# Patient Record
Sex: Male | Born: 1965
Health system: Midwestern US, Community
[De-identification: ages and names within clinical notes are randomized; demographics above are authoritative.]

## PROBLEM LIST (undated history)

## (undated) DIAGNOSIS — K219 Gastro-esophageal reflux disease without esophagitis: Secondary | ICD-10-CM

## (undated) DIAGNOSIS — C4490 Unspecified malignant neoplasm of skin, unspecified: Secondary | ICD-10-CM

## (undated) DIAGNOSIS — M5030 Other cervical disc degeneration, unspecified cervical region: Secondary | ICD-10-CM

## (undated) DIAGNOSIS — F32A Depression, unspecified: Secondary | ICD-10-CM

## (undated) DIAGNOSIS — F419 Anxiety disorder, unspecified: Secondary | ICD-10-CM

## (undated) DIAGNOSIS — R519 Headache, unspecified: Secondary | ICD-10-CM

## (undated) DIAGNOSIS — H547 Unspecified visual loss: Secondary | ICD-10-CM

## (undated) DIAGNOSIS — H5500 Unspecified nystagmus: Secondary | ICD-10-CM

## (undated) DIAGNOSIS — Z9289 Personal history of other medical treatment: Secondary | ICD-10-CM

## (undated) DIAGNOSIS — I2510 Atherosclerotic heart disease of native coronary artery without angina pectoris: Secondary | ICD-10-CM

## (undated) DIAGNOSIS — I219 Acute myocardial infarction, unspecified: Secondary | ICD-10-CM

## (undated) DIAGNOSIS — E781 Pure hyperglyceridemia: Secondary | ICD-10-CM

## (undated) DIAGNOSIS — M51379 Other intervertebral disc degeneration, lumbosacral region without mention of lumbar back pain or lower extremity pain: Secondary | ICD-10-CM

## (undated) DIAGNOSIS — F329 Major depressive disorder, single episode, unspecified: Secondary | ICD-10-CM

## (undated) DIAGNOSIS — C439 Malignant melanoma of skin, unspecified: Secondary | ICD-10-CM

## (undated) DIAGNOSIS — J189 Pneumonia, unspecified organism: Secondary | ICD-10-CM

## (undated) DIAGNOSIS — M5137 Other intervertebral disc degeneration, lumbosacral region: Secondary | ICD-10-CM

## (undated) DIAGNOSIS — R51 Headache: Secondary | ICD-10-CM

## (undated) HISTORY — PX: CARPAL TUNNEL WITH CUBITAL TUNNEL: SHX5608

## (undated) HISTORY — DX: Personal history of other medical treatment: Z92.89

## (undated) HISTORY — PX: MELANOMA EXCISION: SHX5266

## (undated) HISTORY — PX: SKIN CANCER EXCISION: SHX779

## (undated) HISTORY — PX: EYE MUSCLE SURGERY: SHX370

---

## 1993-08-20 HISTORY — PX: KNEE SURGERY: SHX244

## 2007-02-19 ENCOUNTER — Emergency Department (HOSPITAL_COMMUNITY): Admission: EM | Admit: 2007-02-19 | Discharge: 2007-02-19 | Payer: Self-pay | Admitting: Emergency Medicine

## 2007-02-28 ENCOUNTER — Emergency Department (HOSPITAL_COMMUNITY): Admission: EM | Admit: 2007-02-28 | Discharge: 2007-02-28 | Payer: Self-pay | Admitting: Emergency Medicine

## 2013-09-20 DIAGNOSIS — I219 Acute myocardial infarction, unspecified: Secondary | ICD-10-CM

## 2013-09-20 HISTORY — DX: Acute myocardial infarction, unspecified: I21.9

## 2013-10-03 ENCOUNTER — Encounter (HOSPITAL_COMMUNITY)
Admission: EM | Disposition: A | Discharge status: Home / Self Care | Payer: Self-pay | Source: Home / Self Care | Attending: Cardiology

## 2013-10-03 ENCOUNTER — Emergency Department (HOSPITAL_COMMUNITY): Payer: BC Managed Care – PPO

## 2013-10-03 ENCOUNTER — Encounter (HOSPITAL_COMMUNITY): Payer: Self-pay | Admitting: Emergency Medicine

## 2013-10-03 ENCOUNTER — Inpatient Hospital Stay (HOSPITAL_COMMUNITY)
Admission: EM | Admit: 2013-10-03 | Discharge: 2013-10-04 | DRG: 247 | Disposition: A | Discharge status: Home / Self Care | Payer: BC Managed Care – PPO | Attending: Cardiology | Admitting: Cardiology

## 2013-10-03 DIAGNOSIS — Z888 Allergy status to other drugs, medicaments and biological substances status: Secondary | ICD-10-CM

## 2013-10-03 DIAGNOSIS — I2510 Atherosclerotic heart disease of native coronary artery without angina pectoris: Secondary | ICD-10-CM

## 2013-10-03 DIAGNOSIS — I2582 Chronic total occlusion of coronary artery: Secondary | ICD-10-CM | POA: Diagnosis present

## 2013-10-03 DIAGNOSIS — H548 Legal blindness, as defined in USA: Secondary | ICD-10-CM | POA: Diagnosis present

## 2013-10-03 DIAGNOSIS — Z8249 Family history of ischemic heart disease and other diseases of the circulatory system: Secondary | ICD-10-CM

## 2013-10-03 DIAGNOSIS — E781 Pure hyperglyceridemia: Secondary | ICD-10-CM | POA: Diagnosis present

## 2013-10-03 DIAGNOSIS — I2119 ST elevation (STEMI) myocardial infarction involving other coronary artery of inferior wall: Principal | ICD-10-CM | POA: Diagnosis present

## 2013-10-03 DIAGNOSIS — Z720 Tobacco use: Secondary | ICD-10-CM | POA: Diagnosis present

## 2013-10-03 DIAGNOSIS — I219 Acute myocardial infarction, unspecified: Secondary | ICD-10-CM | POA: Diagnosis present

## 2013-10-03 DIAGNOSIS — F17200 Nicotine dependence, unspecified, uncomplicated: Secondary | ICD-10-CM | POA: Diagnosis present

## 2013-10-03 DIAGNOSIS — Z886 Allergy status to analgesic agent status: Secondary | ICD-10-CM

## 2013-10-03 HISTORY — DX: Unspecified visual loss: H54.7

## 2013-10-03 HISTORY — DX: Acute myocardial infarction, unspecified: I21.9

## 2013-10-03 HISTORY — DX: Atherosclerotic heart disease of native coronary artery without angina pectoris: I25.10

## 2013-10-03 HISTORY — DX: Pure hyperglyceridemia: E78.1

## 2013-10-03 HISTORY — DX: Unspecified nystagmus: H55.00

## 2013-10-03 HISTORY — PX: LEFT HEART CATHETERIZATION WITH CORONARY ANGIOGRAM: SHX5451

## 2013-10-03 LAB — POCT I-STAT TROPONIN I: Troponin i, poc: 0.02 ng/mL (ref 0.00–0.08)

## 2013-10-03 LAB — BASIC METABOLIC PANEL
BUN: 19 mg/dL (ref 6–23)
CO2: 24 mEq/L (ref 19–32)
Calcium: 9.1 mg/dL (ref 8.4–10.5)
Chloride: 102 mEq/L (ref 96–112)
Creatinine, Ser: 1.17 mg/dL (ref 0.50–1.35)
GFR calc Af Amer: 82 mL/min — ABNORMAL LOW (ref >90)
GFR calc non Af Amer: 71 mL/min — ABNORMAL LOW (ref >90)
Glucose, Bld: 122 mg/dL — ABNORMAL HIGH (ref 70–99)
Potassium: 4 mEq/L (ref 3.7–5.3)
Sodium: 139 mEq/L (ref 137–147)

## 2013-10-03 LAB — CBC
HCT: 43.7 % (ref 39.0–52.0)
Hemoglobin: 15.7 g/dL (ref 13.0–17.0)
MCH: 32.2 pg (ref 26.0–34.0)
MCHC: 35.9 g/dL (ref 30.0–36.0)
MCV: 89.7 fL (ref 78.0–100.0)
Platelets: 135 10*3/uL — ABNORMAL LOW (ref 150–400)
RBC: 4.87 MIL/uL (ref 4.22–5.81)
RDW: 13 % (ref 11.5–15.5)
WBC: 8.9 10*3/uL (ref 4.0–10.5)

## 2013-10-03 LAB — PROTIME-INR
INR: 0.93 (ref 0.00–1.49)
Prothrombin Time: 12.3 seconds (ref 11.6–15.2)

## 2013-10-03 LAB — MRSA PCR SCREENING: MRSA by PCR: NEGATIVE

## 2013-10-03 LAB — PRO B NATRIURETIC PEPTIDE: Pro B Natriuretic peptide (BNP): 15.4 pg/mL (ref 0–125)

## 2013-10-03 SURGERY — LEFT HEART CATHETERIZATION WITH CORONARY ANGIOGRAM
Anesthesia: LOCAL

## 2013-10-03 MED ORDER — MORPHINE SULFATE 4 MG/ML IJ SOLN: 4.0000 mg | Freq: Once | INTRAMUSCULAR | Status: DC

## 2013-10-03 MED ORDER — BIVALIRUDIN 250 MG IV SOLR
INTRAVENOUS | Status: AC
  Filled 2013-10-03: qty 250

## 2013-10-03 MED ORDER — ASPIRIN EC 81 MG PO TBEC
81.0000 mg | DELAYED_RELEASE_TABLET | Freq: Every day | ORAL | Status: DC
  Administered 2013-10-04: 81 mg via ORAL
  Filled 2013-10-03: qty 1

## 2013-10-03 MED ORDER — TIROFIBAN HCL IV 5 MG/100ML
0.1500 ug/kg/min | INTRAVENOUS | Status: AC
  Administered 2013-10-03 – 2013-10-04 (×4): 0.15 ug/kg/min via INTRAVENOUS
  Filled 2013-10-03 (×6): qty 100

## 2013-10-03 MED ORDER — ASPIRIN 300 MG RE SUPP: 300.0000 mg | RECTAL | Status: DC

## 2013-10-03 MED ORDER — HEPARIN (PORCINE) IN NACL 2-0.9 UNIT/ML-% IJ SOLN
INTRAMUSCULAR | Status: AC
  Filled 2013-10-03: qty 1000

## 2013-10-03 MED ORDER — ONDANSETRON HCL 4 MG/2ML IJ SOLN
INTRAMUSCULAR | Status: AC
  Filled 2013-10-03: qty 2

## 2013-10-03 MED ORDER — TICAGRELOR 90 MG PO TABS
ORAL_TABLET | ORAL | Status: AC
  Filled 2013-10-03: qty 1

## 2013-10-03 MED ORDER — NITROGLYCERIN IN D5W 200-5 MCG/ML-% IV SOLN: 3.0000 ug/min | INTRAVENOUS | Status: DC

## 2013-10-03 MED ORDER — ATORVASTATIN CALCIUM 80 MG PO TABS
80.0000 mg | ORAL_TABLET | Freq: Every day | ORAL | Status: DC
  Filled 2013-10-03: qty 1

## 2013-10-03 MED ORDER — SODIUM CHLORIDE 0.9 % IJ SOLN
3.0000 mL | Freq: Two times a day (BID) | INTRAMUSCULAR | Status: DC
  Administered 2013-10-04: 3 mL via INTRAVENOUS

## 2013-10-03 MED ORDER — ASPIRIN 81 MG PO CHEW: 324.0000 mg | CHEWABLE_TABLET | ORAL | Status: DC

## 2013-10-03 MED ORDER — SODIUM CHLORIDE 0.9 % IV SOLN
250.0000 mL | INTRAVENOUS | Status: DC | PRN
Start: 2013-10-03 — End: 2013-10-04
  Administered 2013-10-04: 04:00:00 via INTRAVENOUS

## 2013-10-03 MED ORDER — METHYLPREDNISOLONE SODIUM SUCC 125 MG IJ SOLR
125.0000 mg | Freq: Once | INTRAMUSCULAR | Status: AC
  Administered 2013-10-03: 125 mg via INTRAVENOUS

## 2013-10-03 MED ORDER — LIDOCAINE HCL (PF) 1 % IJ SOLN
INTRAMUSCULAR | Status: AC
  Filled 2013-10-03: qty 30

## 2013-10-03 MED ORDER — NITROGLYCERIN IN D5W 200-5 MCG/ML-% IV SOLN
2.0000 ug/min | Freq: Once | INTRAVENOUS | Status: AC
  Administered 2013-10-03: 5 ug/min via INTRAVENOUS

## 2013-10-03 MED ORDER — TIROFIBAN HCL IV 5 MG/100ML
INTRAVENOUS | Status: AC
  Filled 2013-10-03: qty 100

## 2013-10-03 MED ORDER — NITROGLYCERIN 0.4 MG SL SUBL
0.4000 mg | SUBLINGUAL_TABLET | SUBLINGUAL | Status: DC | PRN
Start: 2013-10-03 — End: 2013-10-04

## 2013-10-03 MED ORDER — SODIUM CHLORIDE 0.9 % IV SOLN: 1000.0000 mL | INTRAVENOUS | Status: DC

## 2013-10-03 MED ORDER — FENTANYL CITRATE 0.05 MG/ML IJ SOLN
INTRAMUSCULAR | Status: AC
  Filled 2013-10-03: qty 2

## 2013-10-03 MED ORDER — SODIUM CHLORIDE 0.9 % IV SOLN
1.0000 mL/kg/h | INTRAVENOUS | Status: AC
  Administered 2013-10-03: 1 mL/kg/h via INTRAVENOUS

## 2013-10-03 MED ORDER — ONDANSETRON HCL 4 MG/2ML IJ SOLN: 4.0000 mg | Freq: Four times a day (QID) | INTRAMUSCULAR | Status: DC | PRN

## 2013-10-03 MED ORDER — ASPIRIN 81 MG PO CHEW: 324.0000 mg | CHEWABLE_TABLET | Freq: Once | ORAL | Status: DC

## 2013-10-03 MED ORDER — HEPARIN BOLUS VIA INFUSION
4000.0000 [IU] | Freq: Once | INTRAVENOUS | Status: AC
  Administered 2013-10-03: 4000 [IU] via INTRAVENOUS

## 2013-10-03 MED ORDER — SODIUM CHLORIDE 0.9 % IJ SOLN: 3.0000 mL | INTRAMUSCULAR | Status: DC | PRN

## 2013-10-03 MED ORDER — HEPARIN (PORCINE) IN NACL 100-0.45 UNIT/ML-% IJ SOLN: 12.0000 [IU]/kg/h | Freq: Once | INTRAMUSCULAR | Status: DC

## 2013-10-03 MED ORDER — METHYLPREDNISOLONE SODIUM SUCC 125 MG IJ SOLR
INTRAMUSCULAR | Status: AC
  Filled 2013-10-03: qty 2

## 2013-10-03 MED ORDER — ACETAMINOPHEN 325 MG PO TABS: 650.0000 mg | ORAL_TABLET | ORAL | Status: DC | PRN

## 2013-10-03 MED ORDER — METOPROLOL TARTRATE 12.5 MG HALF TABLET
12.5000 mg | ORAL_TABLET | Freq: Two times a day (BID) | ORAL | Status: DC
  Administered 2013-10-03 – 2013-10-04 (×2): 12.5 mg via ORAL
  Filled 2013-10-03 (×3): qty 1

## 2013-10-03 MED ORDER — NITROGLYCERIN IN D5W 200-5 MCG/ML-% IV SOLN
INTRAVENOUS | Status: AC
  Filled 2013-10-03: qty 250

## 2013-10-03 MED ORDER — DIPHENHYDRAMINE HCL 50 MG/ML IJ SOLN
INTRAMUSCULAR | Status: AC
  Filled 2013-10-03: qty 1

## 2013-10-03 MED ORDER — DIPHENHYDRAMINE HCL 50 MG/ML IJ SOLN
25.0000 mg | Freq: Once | INTRAMUSCULAR | Status: AC
  Administered 2013-10-03: 25 mg via INTRAVENOUS

## 2013-10-03 MED ORDER — TICAGRELOR 90 MG PO TABS
90.0000 mg | ORAL_TABLET | Freq: Two times a day (BID) | ORAL | Status: DC
  Administered 2013-10-04: 90 mg via ORAL
  Filled 2013-10-03 (×3): qty 1

## 2013-10-03 MED ORDER — MIDAZOLAM HCL 2 MG/2ML IJ SOLN
INTRAMUSCULAR | Status: AC
  Filled 2013-10-03: qty 2

## 2013-10-03 MED ORDER — OXYCODONE-ACETAMINOPHEN 5-325 MG PO TABS
1.0000 | ORAL_TABLET | ORAL | Status: DC | PRN
Start: 2013-10-03 — End: 2013-10-04

## 2013-10-03 MED ORDER — SODIUM CHLORIDE 0.9 % IV SOLN: 1000.0000 mL | Freq: Once | INTRAVENOUS | Status: DC

## 2013-10-03 NOTE — ED Provider Notes (Signed)
CSN: [id number]     Arrival date & time 10/03/13  7 History   First MD Initiated Contact with Patient 10/03/13 1733     Chief Complaint  Patient presents with  . Chest Pain     HPI  Patient presents with new chest pain.  Pain began last one hour ago, as the patient was cleaning his house.  Since onset has been persistent.  Pain is largely about the anterior chest, greater on the right.  Pain is severe, sore, pressure-like with radiation towards his throat. Pain has not been improved with morphine, nitroglycerin. The pain is not pleuritic. There is mild associated dyspnea. There is mild associated lightheadedness, no syncope. There is associated nausea, no vomiting. Patient has no history of similar pain, no history of cardiac disease. Patient denies medical problems.  The patient does smoke. History of present illness is oriented to patient and EMS.  Patient was a CODE STEMI  History reviewed. No pertinent past medical history. Past Surgical History  Procedure Laterality Date  . Knee surgery     History reviewed. No pertinent family history. History  Substance Use Topics  . Smoking status: Current Every Day Smoker -- 1.00 packs/day    Types: Cigarettes  . Smokeless tobacco: Not on file  . Alcohol Use: Not on file    Review of Systems  Constitutional:       Per HPI, otherwise negative  HENT:       Per HPI, otherwise negative  Respiratory:       Per HPI, otherwise negative  Cardiovascular:       Per HPI, otherwise negative  Gastrointestinal: Negative for vomiting.  Endocrine:       Negative aside from HPI  Genitourinary:       Neg aside from HPI   Musculoskeletal:       Per HPI, otherwise negative  Skin: Negative.   Neurological: Negative for syncope.      Allergies  Iodine  Home Medications  No current outpatient prescriptions on file. There were no vitals taken for this visit. Physical Exam  Nursing note and vitals reviewed. Constitutional: He is  oriented to person, place, and time. He appears well-developed. He appears distressed.  HENT:  Head: Normocephalic and atraumatic.  Eyes: Conjunctivae and EOM are normal.  Cardiovascular: Normal rate and regular rhythm.   Pulmonary/Chest: Effort normal. No stridor. No respiratory distress.  Abdominal: He exhibits no distension.  Musculoskeletal: He exhibits no edema.  Neurological: He is alert and oriented to person, place, and time.  Skin: Skin is warm. He is diaphoretic.  Psychiatric: He has a normal mood and affect.    ED Course  Procedures (including critical care time) Labs Review Labs Reviewed  CBC - Abnormal; Notable for the following:    Platelets 135 (*)    All other components within normal limits  BASIC METABOLIC PANEL - Abnormal; Notable for the following:    Glucose, Bld 122 (*)    GFR calc non Af Amer 71 (*)    GFR calc Af Amer 82 (*)    All other components within normal limits  MRSA PCR SCREENING  PROTIME-INR  PRO B NATRIURETIC PEPTIDE  TROPONIN I  TROPONIN I  TROPONIN I  TSH  LIPID PANEL  CBC  BASIC METABOLIC PANEL  PLATELET COUNT  LIPID PANEL  POCT I-STAT TROPONIN I   Imaging Review Dg Chest Port 1 View  10/03/2013   CLINICAL DATA:  Substernal chest pain.  EXAM: PORTABLE CHEST -  1 VIEW  COMPARISON:  No priors.  FINDINGS: Mild diffuse interstitial prominence. Mild cephalization of the pulmonary vasculature. Heart size is within normal limits. The patient is rotated to the left on today's exam, resulting in distortion of the mediastinal contours and reduced diagnostic sensitivity and specificity for mediastinal pathology. Lung volumes are low. Bibasilar opacities may reflect areas of subsegmental atelectasis.  IMPRESSION: 1. Low lung volumes with probable bibasilar subsegmental atelectasis. 2. There is some mild cephalization of the pulmonary vasculature and very mild indistinctness of the interstitial markings, which may suggest very mild interstitial  pulmonary edema.   Electronically Signed   By: Vinnie Langton M.D.   On: 10/03/2013 17:57    On arrival I interviewed the patient and discussed his case with our EMS team.  Per EMS, the patient had minimal pain improvement with nitroglycerin, morphine. Patient's blood pressure was stable in route.    EKG on arrival was similar to that on EMS, the patient had a performed in route. There was elevation of 1-2 mm in the inferior leads, with rate in the 60s.   there is computer error prohibiting entry of his first ED EKG.   Update: With continued pain and the patient will receive analgesia Cardiology team is aware of the patient's arrival.  On repeat exam the patient appears slightly better, vital signs remained similar.  MDM   This patient presents as a code STEMI, with EMS EKG readings concerning for inferior infarct. On exam the patient is awake and alert, the diaphoretic and uncomfortable appearing.  Patient's blood pressure is initially okay, though he continues to have pain, concern for ongoing coronary ischemia. Patient received heparin bolus, drip, analgesia, fluids in the emergency department. After monitoring here, stabilization, the patient was transferred to our cardiology catheterization lab for definitive management.   Late addendum: On the chart review patient's catheterization was uncomplicated, and he received stenting of stenotic lesion.   CRITICAL CARE Performed by: Carmin Muskrat Total critical care time: 40 Critical care time was exclusive of separately billable procedures and treating other patients. Critical care was necessary to treat or prevent imminent or life-threatening deterioration. Critical care was time spent personally by me on the following activities: development of treatment plan with patient and/or surrogate as well as nursing, discussions with consultants, evaluation of patient's response to treatment, examination of patient, obtaining history  from patient or surrogate, ordering and performing treatments and interventions, ordering and review of laboratory studies, ordering and review of radiographic studies, pulse oximetry and re-evaluation of patient's condition.     Carmin Muskrat, MD 10/03/13 2118

## 2013-10-03 NOTE — Interval H&P Note (Signed)
History and Physical Interval Note:  10/03/2013 7:02 PM  Justin Santana  has presented today for surgery, with the diagnosis of stemi  The various methods of treatment have been discussed with the patient and family. After consideration of risks, benefits and other options for treatment, the patient has consented to  Procedure(s): LEFT HEART CATHETERIZATION WITH CORONARY ANGIOGRAM (N/A) as a surgical intervention .  The patient's history has been reviewed, patient examined, no change in status, stable for surgery.  I have reviewed the patient's chart and labs.  Questions were answered to the patient's satisfaction.    Cath Lab Visit (complete for each Cath Lab visit)  Clinical Evaluation Leading to the Procedure:   ACS: yes  Non-ACS:    Anginal Classification: CCS IV  Anti-ischemic medical therapy: No Therapy  Non-Invasive Test Results: No non-invasive testing performed  Prior CABG: No previous CABG       Sherren Mocha

## 2013-10-03 NOTE — H&P (Signed)
CARDIOLOGY ADMISSION NOTE  Patient ID: Justin Santana MRN: [id number] DOB/AGE: Aug 04, 1966 48 y.o.  Admit date: 10/03/2013 Primary Physician   Dr. Wenda Overland Primary Cardiologist   None Chief Complaint    Chest pain  HPI:   The patient has no prior cardiac history.  He does have ongoing tobacco abuse.  He reports that after vacuuming he started to have mid chest pain.  It was sharp and severe with radiation to the right arm and some right shoulder pain.  He developed acute diaphoresis.  EMS was called.  He was given ASA and SLNTG x 3.  He received 4 mg of morphine.  He did have improvement but not complete resolution of his pain.  He was transported via [facility] to [facility] ED and is taken urgently to the cath lab.  Of note he awoke with a headache this morning.     Past Medical History  Diagnosis Date  . Hypertriglyceridemia   . Nystagmus   . Blind     Past Surgical History  Procedure Laterality Date  . Knee surgery      Allergies  Allergen Reactions  . Aleve [Naproxen Sodium] Other (See Comments)    Sweating, difficulty breathing  . Iodine Other (See Comments)    Blistering of skin   No current facility-administered medications on file prior to encounter.   No current outpatient prescriptions on file prior to encounter.   History   Social History  . Marital Status: Divorced    Spouse Name: N/A    Number of Children: 1  . Years of Education: N/A   Occupational History  . Not on file.   Social History Main Topics  . Smoking status: Current Every Day Smoker -- 1.00 packs/day for 37 years    Types: Cigarettes  . Smokeless tobacco: Not on file  . Alcohol Use: Not on file  . Drug Use: Not on file  . Sexual Activity: Not on file   Other Topics Concern  . Not on file   Social History Narrative   Lives with daughter.      Family History  Problem Relation Age of Onset  . Heart disease Father     Pacemaker     ROS:  Snores.  Otherwise as stated in the HPI and  negative for all other systems.  Physical Exam: Blood pressure 124/74, pulse 61, temperature 98.1 F (36.7 C), temperature source Oral, resp. rate 24, height 5\' 10"  (1.778 m), weight 205 lb (92.987 kg), SpO2 93.00%.  GENERAL:  Well appearing HEENT:  Pupils equal round and reactive, fundi not visualized, oral mucosa unremarkable NECK:  No jugular venous distention, waveform within normal limits, carotid upstroke brisk and symmetric, no bruits, no thyromegaly LYMPHATICS:  No cervical, inguinal adenopathy LUNGS:  Clear to auscultation bilaterally BACK:  No CVA tenderness CHEST:  Unremarkable HEART:  PMI not displaced or sustained,S1 and S2 within normal limits, no S3, no S4, no clicks, no rubs, no murmurs ABD:  Flat, positive bowel sounds normal in frequency in pitch, no bruits, no rebound, no guarding, no midline pulsatile mass, no hepatomegaly, no splenomegaly EXT:  2 plus pulses throughout, no edema, no cyanosis no clubbing SKIN:  No rashes no nodules NEURO:  Cranial nerves II through XII grossly intact, motor grossly intact throughout PSYCH:  Cognitively intact, oriented to person place and time  Labs:  Lab Results  Component Value Date   WBC 8.9 10/03/2013    Radiology:  CXR:  1. Low  lung volumes with probable bibasilar subsegmental  atelectasis.  2. There is some mild cephalization of the pulmonary vasculature and  very mild indistinctness of the interstitial markings, which may  suggest very mild interstitial pulmonary edema.   EKG:  NSR, rate 61, axis WNL, intervals WNL, inferior 1 -2 mm ST elevation III, aVF  ASSESSMENT AND PLAN:    ACUTE INFERIOR MI:  The patient was treated with IV NTG, heparin, ASA.  He will have urgent cardiac cath.  The patient understands that risks included but are not limited to stroke (1 in 1000), death (1 in 109), kidney failure [usually temporary] (1 in 500), bleeding (1 in 200), allergic reaction [possibly serious] (1 in 200).  The patient  understands and agrees to proceed.   TOBACCO ABUSE:  He will be educated.  SignedMinus Breeding 10/03/2013, 6:05 PM

## 2013-10-03 NOTE — ED Notes (Addendum)
Presents with substernal chest pain began 17:05 associated with light headedness, SOB, nausea, diaphoresis, pt is pale. Pain radiates to back shoulder and into left arm. Pain began while at rest. Rated 9/10 decreased with nitro to 6/10, given 324 ASA

## 2013-10-03 NOTE — ED Notes (Signed)
Family in to see pt.  Cardiac Cath procedure explained to pt and family by Dr. Percival Spanish.  Pt to Cath lab  All belongings   (shorts, underwear, socks and glasses, shirt) given to family

## 2013-10-03 NOTE — Progress Notes (Signed)
Patient received from cath lab at [facility] s/p PCI and stent placement to OM 1.  Access was via right radial artery.  TR band in place and secure.  Palpable hematoma with bruising extending approximately 4 cm above band.  Area marked and manual pressure applied to site for 5 minutes.  Area softened to palpation with no increase in size noted.  Wrist and hand kept elevated on pillow.  TR band deflation started at 2015 and completed by 2115.  Hematoma smaller in size, but still present.  Sterile 2X2 dressing covered by tegaderm to puncture site and pressure dressing placed over hematoma for 30 minutes.  No further edema noted; only residual bruising present.  Will continue to monitor site closely for any bleeding or hematomas.

## 2013-10-03 NOTE — ED Notes (Signed)
Dr Hochrein at bedside 

## 2013-10-03 NOTE — CV Procedure (Signed)
    Cardiac Catheterization Procedure Note  Name: Justin Santana MRN: [id number] DOB: 1965-11-26  Procedure: Left Heart Cath, Selective Coronary Angiography, LV angiography, PTCA and stenting of the first OM  Indication: Inferior STEMI. 48 year-old with EKG changes suggestive of inferior STEMI as well as typical symptoms of ACS. He arrived via [facility] and we were involved with another STEMI procedure. There was a short delay while we called in a second call team.   Procedural Details:  The right wrist was prepped, draped, and anesthetized with 1% lidocaine. Using the modified Seldinger technique, a 5/6 French sheath was introduced into the right radial artery. 3 mg of verapamil was administered through the sheath, weight-based unfractionated heparin was administered intravenously. Standard Judkins catheters were used for selective coronary angiography and left ventriculography. Ventriculography was completed at the procedure completion (post-PCI).  Catheter exchanges were performed over an exchange length guidewire.  PROCEDURAL FINDINGS Hemodynamics: AO 115/65 LV 113/18   Coronary angiography: Coronary dominance: right  Left mainstem: Arises from the left cusp. Widely patent without stenosis. Divides into the LAD and LCx.  Left anterior descending (LAD): The proximal LAD has diffuse 50% stenosis leading into an 80% mid-stenosis. The mid and distal LAD are patent. The first diagonal is small and there is a 90% stenosis in the proximal vessel.   Left circumflex (LCx): The AV circumflex is patent. The first OM is occluded with contrast staining suggestive of acute closure. The second OM is patent.   Right coronary artery (RCA): Large, dominant vessel without significant disease. There is diffuse irregularity before the bifurcation of the PDA and PLA branches.   Left ventriculography:The mid-inferior wall is hypokinetic, but the LVEF is fairly preserved and estimated at 55%, there is  no significant mitral regurgitation   PCI Note:  Following the diagnostic procedure, the decision was made to proceed with PCI.  Weight-based bivalirudin was given for anticoagulation. Once a therapeutic ACT was achieved, a 6 [place] XB 3.5 guide catheter was inserted.  A cougar coronary guidewire was used to cross the lesion in the first OM.  The lesion was predilated with a 2.5 mm balloon.  The lesion was then stented with a 3.25 x 18 mm Xience Alpine DES stent.  The stent was postdilated with a 3.5 mm noncompliant balloon.  Following PCI, there was 0% residual stenosis and TIMI-3 flow. Final angiography confirmed an excellent result. The patient tolerated the procedure well. There were no immediate procedural complications. A TR band was used for radial hemostasis. The patient was transferred to the post catheterization recovery area for further monitoring.  PCI Data: Vessel - OM1 Percent Stenosis (pre)  100 TIMI-flow 0 Stent 3.25 x 18 mm Xience DES Percent Stenosis (post) 0 TIMI-flow (post) 3  Final Conclusions:   1. Total occlusion of the OM1 treated successfully with primary PCI (DES platform) 2. Severe proximal/mid LAD stenosis  3. Patent, dominant RCA with minor nonobstructive disease 4. Mild segmental LV dysfunction  Recommendations:  Post-MI medical therapy, Staged PCI of the LAD prior to discharge.  Sherren Mocha 10/03/2013, 7:06 PM

## 2013-10-04 DIAGNOSIS — I219 Acute myocardial infarction, unspecified: Secondary | ICD-10-CM

## 2013-10-04 DIAGNOSIS — F17200 Nicotine dependence, unspecified, uncomplicated: Secondary | ICD-10-CM

## 2013-10-04 LAB — CBC
HCT: 44.7 % (ref 39.0–52.0)
Hemoglobin: 15.4 g/dL (ref 13.0–17.0)
MCH: 31 pg (ref 26.0–34.0)
MCHC: 34.5 g/dL (ref 30.0–36.0)
MCV: 90.1 fL (ref 78.0–100.0)
Platelets: 146 10*3/uL — ABNORMAL LOW (ref 150–400)
RBC: 4.96 MIL/uL (ref 4.22–5.81)
RDW: 13.1 % (ref 11.5–15.5)
WBC: 16.7 10*3/uL — ABNORMAL HIGH (ref 4.0–10.5)

## 2013-10-04 LAB — BASIC METABOLIC PANEL
BUN: 15 mg/dL (ref 6–23)
CO2: 21 mEq/L (ref 19–32)
Calcium: 8.9 mg/dL (ref 8.4–10.5)
Chloride: 106 mEq/L (ref 96–112)
Creatinine, Ser: 0.96 mg/dL (ref 0.50–1.35)
GFR calc Af Amer: >90 mL/min (ref >90)
GFR calc non Af Amer: >90 mL/min (ref >90)
Glucose, Bld: 182 mg/dL — ABNORMAL HIGH (ref 70–99)
Potassium: 4.2 mEq/L (ref 3.7–5.3)
Sodium: 141 mEq/L (ref 137–147)

## 2013-10-04 LAB — LIPID PANEL
Cholesterol: 220 mg/dL — ABNORMAL HIGH (ref 0–200)
HDL: 38 mg/dL — ABNORMAL LOW (ref >39)
LDL Cholesterol: 164 mg/dL — ABNORMAL HIGH (ref 0–99)
Total CHOL/HDL Ratio: 5.8 RATIO
Triglycerides: 88 mg/dL (ref <150)
VLDL: 18 mg/dL (ref 0–40)

## 2013-10-04 LAB — TROPONIN I
Troponin I: >20 ng/mL (ref <0.30)
Troponin I: 15.78 ng/mL (ref <0.30)

## 2013-10-04 LAB — PLATELET COUNT: Platelets: 147 10*3/uL — ABNORMAL LOW (ref 150–400)

## 2013-10-04 LAB — TSH: TSH: 0.595 u[IU]/mL (ref 0.350–4.500)

## 2013-10-04 MED ORDER — TICAGRELOR 90 MG PO TABS: 90.0000 mg | ORAL_TABLET | Freq: Two times a day (BID) | ORAL | Status: DC

## 2013-10-04 MED ORDER — ASPIRIN 81 MG PO TBEC: 81.0000 mg | DELAYED_RELEASE_TABLET | Freq: Every day | ORAL | Status: AC

## 2013-10-04 MED ORDER — ATORVASTATIN CALCIUM 80 MG PO TABS: 80.0000 mg | ORAL_TABLET | Freq: Every day | ORAL | Status: DC

## 2013-10-04 MED ORDER — NITROGLYCERIN 0.4 MG SL SUBL: 0.4000 mg | SUBLINGUAL_TABLET | SUBLINGUAL | Status: DC | PRN

## 2013-10-04 MED ORDER — METOPROLOL TARTRATE 25 MG PO TABS: 12.5000 mg | ORAL_TABLET | Freq: Two times a day (BID) | ORAL | Status: DC

## 2013-10-04 NOTE — Progress Notes (Signed)
Utilization Review Completed.Justin Santana T2/15/2015  

## 2013-10-04 NOTE — Progress Notes (Signed)
   CARE MANAGEMENT NOTE 10/04/2013  Patient:  Justin Santana, Justin Santana   Account Number:  [id number]  Date Initiated:  10/04/2013  Documentation initiated by:  [facility]  Subjective/Objective Assessment:   adm: stemi     Action/Plan:   discharge planning   Anticipated DC Date:  10/06/2013   Anticipated DC Plan:  [place]  CM consult      Choice offered to / List presented to:             Status of service:  Completed, signed off Medicare Important Message given?   (If response is "NO", the following Medicare IM given date fields will be blank) Date Medicare IM given:   Date Additional Medicare IM given:    Discharge Disposition:    Per UR Regulation:    If discussed at Long Length of Stay Meetings, dates discussed:    Comments:  10/04/13 08:30 CM spoke with pt in room and gave him a Brilinta packet with a free 30 day trial card and discount card for refills.  Pt states he has [place] and has Medicare as secondary and  PCP is Oren Binet, MD or [name], MD.  Pt is adamant on getting out as he states he is a single parent. RN made aware of this concern.  No other CM needs were communicated.  Mariane Masters, BSN, CM [phone].

## 2013-10-04 NOTE — Discharge Summary (Signed)
Physician Discharge Summary  Patient ID: Justin Santana MRN: [id number] DOB/AGE: 1966-02-26 48 y.o.  Admit date: 10/03/2013 Discharge date: 10/04/2013  Admission Diagnoses: Acute Myocardial Infarction  Discharge Diagnoses:  Active Problems:   Acute myocardial infarction   Tobacco abuse   Discharged Condition: stable  Hospital Course: The patient is a 48 y/o male who presented to [facility] the evening of 10/03/13 with an acute myocardial infarction. The patient denied any prior cardiac history. He does have ongoing tobacco abuse. He reported developing mid chest pain earlier in the day, after vacuuming.  It was sharp and severe with radiation to the right arm and some right shoulder pain. He developed acute diaphoresis. EMS was called. He was given ASA and SL NTG x 3. He received 4 mg of morphine. He did have improvement but not complete resolution of his pain. He had EKG changes suggestive of inferior STEMI. Code STEMI was activated. He was transported via [facility] to the [facility] ED and was taken urgently to the cath lab. The procedure was performed by Dr. [name]. He was found to have total occlusion of the OM1. There was also severe proximal/mid LAD stenosis. Patent, dominant RCA with minor nonobstructive disease. Mild segmental LV dysfunction was noted with an EF of 55%. He underwent successful PCI utilizing a DES to the totally occluded OM1. Staged PCI of the LAD prior to discharge was recommended. He left the cath lab in stable condition and was placed on DAPT with  ASA + Brilinta as well as Lopressor and Atorvastatin.   On post cath day 1, he was seen by Dr. Haroldine Laws. The Pt was very anxious about an upcoming custody battle and wanting to leave AMA and return at a later date for care. It was highly recommended that the patient stay to complete the recommended treatment plan. A long discussion was held between Dr. Haroldine Laws and the patient. All aspects of the encounter were discussed. Dr.  Haroldine Laws discussed the pros and cons, including the risk of reinfarction and death. The patient verbalized understanding of the risk of leaving, but he insisted on being discharged. It was explained to him the need to f/u to address his LAD lesion. Also discussed was the risk of stent thrombosis if not complaint with Brilinta. A Brilinta card for a free 30 day supply was provided. He was instructed to continue asa, lopressor and atorvastatin. He was instructed to follow-up with Dr. [name].    Consults: None  Significant Diagnostic Studies:   Emergent [facility] 10/03/13 PCI Data:  Vessel - OM1  Percent Stenosis (pre) 100  TIMI-flow 0  Stent 3.25 x 18 mm Xience DES  Percent Stenosis (post) 0  TIMI-flow (post) 3  Final Conclusions:  1. Total occlusion of the OM1 treated successfully with primary PCI (DES platform)  2. Severe proximal/mid LAD stenosis  3. Patent, dominant RCA with minor nonobstructive disease  4. Mild segmental LV dysfunction  Recommendations:  Post-MI medical therapy, Staged PCI of the LAD prior to discharge.  Sherren Mocha  10/03/2013, 7:06 PM   Treatments: See Hospital Course  Discharge Exam: Blood pressure 125/71, pulse 71, temperature 97.9 F (36.6 C), temperature source Oral, resp. rate 20, height 5\' 10"  (1.778 m), weight 195 lb 15.8 oz (88.9 kg), SpO2 95.00%.   Disposition: Final discharge disposition not confirmed      Discharge Orders   Future Orders Complete By Expires   Diet - low sodium heart healthy  As directed    Increase activity slowly  As directed        Medication List    STOP taking these medications       ibuprofen 200 MG tablet  Commonly known as:  ADVIL,MOTRIN      TAKE these medications       aspirin 81 MG EC tablet  Take 1 tablet (81 mg total) by mouth daily.     atorvastatin 80 MG tablet  Commonly known as:  LIPITOR  Take 1 tablet (80 mg total) by mouth daily at 6 PM.     metoprolol tartrate 25 MG tablet  Commonly known  as:  LOPRESSOR  Take 0.5 tablets (12.5 mg total) by mouth 2 (two) times daily.     nitroGLYCERIN 0.4 MG SL tablet  Commonly known as:  NITROSTAT  Place 1 tablet (0.4 mg total) under the tongue every 5 (five) minutes x 3 doses as needed for chest pain.     Ticagrelor 90 MG Tabs tablet  Commonly known as:  BRILINTA  Take 1 tablet (90 mg total) by mouth 2 (two) times daily.     Ticagrelor 90 MG Tabs tablet  Commonly known as:  BRILINTA  Take 1 tablet (90 mg total) by mouth 2 (two) times daily.       Follow-up Information   Follow up with Sherren Mocha, MD In 2 weeks. (call to schedule a hospital follow-up appointment in 2 weeks)    Specialty:  Cardiology   Contact information:   [address] [address] [phone]      TIME SPENT ON DISCHARGE, INCLUDING PHYSICIAN TIME: >30 MINUTES  Signed: Lyda Jester 10/04/2013, 11:55 AM  Patient seen and examined with Ellen Henri, PA-C. We discussed all aspects of the encounter. I agree with the assessment and plan as stated above. He is improved this am and insisting on going home. We talked about the pros and cons about this and he understands but still wants to go home. He understands the risk of reinfarction and death. I also explained the need to f/u to address his LAD lesion. Also discussed risk of stent thrombosis if not complaint with Brilinta. A Brilinta card has been provided. Continue asa, lopressor, atorva .  F/u in 2 weeks with Dr. [name]. Cardiac rehab referral placed.   Benay Spice 4:33 PM

## 2013-10-04 NOTE — Progress Notes (Signed)
CRITICAL VALUE ALERT  Critical value received:  Troponin>20  Date of notification:  10/04/2013  Time of notification:  0142  Critical value read back:yes  Nurse who received alert:  Quitman Livings RN  MD notified (1st page):  Not called; expected value  Time of first page:  Not called  MD notified (2nd page):  Time of second page:  Responding MD:  No one  Time MD responded:  Not called

## 2013-10-04 NOTE — Progress Notes (Signed)
Subjective: Mr. Mcphearson 48 yo man no significant pmh admitted 2/14 with inferior STEMI in [place]. Plan for staged PCI before d/c. Pt very anxious about custody battle and wanting to leave AMA and return at a later date for care.   LHC 10/03/13: EF 55%, mid-inferior wall hypokinesis, 3.25x73mm Xience DES to OM1, severe proximal/mid LAD stenosis, nonobstructive dz of RCA  Left mainstem: Arises from the left cusp. Widely patent without stenosis. Divides into the LAD and LCx.  Left anterior descending (LAD): The proximal LAD has diffuse 50% stenosis leading into an 80% mid-stenosis. The mid and distal LAD are patent. The first diagonal is small and there is a 90% stenosis in the proximal vessel.  Left circumflex (LCx): The AV circumflex is patent. The first OM is occluded with contrast staining suggestive of acute closure. The second OM is patent.  Right coronary artery (RCA): Large, dominant vessel without significant disease. There is diffuse irregularity before the bifurcation of the PDA and PLA branches.  Left ventriculography:The mid-inferior wall is hypokinetic, but the LVEF is fairly preserved and estimated at 55%, there is no significant mitral regurgitation    Objective: Vital signs in last 24 hours: Filed Vitals:   10/04/13 0500 10/04/13 0600 10/04/13 0700 10/04/13 0800  BP: 113/78 105/73 118/78 129/79  Pulse: 62 83 70 65  Temp:    97.4 F (36.3 C)  TempSrc:    Oral  Resp: 16 18 20    Height:      Weight:      SpO2: 96% 95% 94% 94%   Weight change:   Intake/Output Summary (Last 24 hours) at 10/04/13 0916 Last data filed at 10/04/13 0800  Gross per 24 hour  Intake 1615.78 ml  Output   1525 ml  Net  90.78 ml   General: resting in bed, NAD  HEENT: PERRL, EOMI, no scleral icterus Cardiac: RRR, no rubs, murmurs or gallops Pulm: clear to auscultation bilaterally, moving normal volumes of air Abd: soft, nontender, nondistended, BS present Ext: warm and well perfused, no pedal  edema, right radial cath site no bruit, no frank bleeding Neuro: alert and oriented X3, cranial nerves II-XII grossly intact  Lab Results: Basic Metabolic Panel:  Recent Labs Lab 10/03/13 1732  NA 139  K 4.0  CL 102  CO2 24  GLUCOSE 122*  BUN 19  CREATININE 1.17  CALCIUM 9.1   CBC:  Recent Labs Lab 10/03/13 1732 10/04/13 0040  WBC 8.9  --   HGB 15.7  --   HCT 43.7  --   MCV 89.7  --   PLT 135* 147*   Cardiac Enzymes:  Recent Labs Lab 10/04/13 0040  TROPONINI >20.00*   BNP:  Recent Labs Lab 10/03/13 1737  PROBNP 15.4   Coagulation:  Recent Labs Lab 10/03/13 1732  LABPROT 12.3  INR 0.93   Cardiac studies: Echo 10/04/13: pending  Micro Results: Studies/Results: Dg Chest Port 1 View  10/03/2013   CLINICAL DATA:  Substernal chest pain.  EXAM: PORTABLE CHEST - 1 VIEW  COMPARISON:  No priors.  FINDINGS: Mild diffuse interstitial prominence. Mild cephalization of the pulmonary vasculature. Heart size is within normal limits. The patient is rotated to the left on today's exam, resulting in distortion of the mediastinal contours and reduced diagnostic sensitivity and specificity for mediastinal pathology. Lung volumes are low. Bibasilar opacities may reflect areas of subsegmental atelectasis.  IMPRESSION: 1. Low lung volumes with probable bibasilar subsegmental atelectasis. 2. There is some mild cephalization of the pulmonary  vasculature and very mild indistinctness of the interstitial markings, which may suggest very mild interstitial pulmonary edema.   Electronically Signed   By: Vinnie Langton M.D.   On: 10/03/2013 17:57   Medications: I have reviewed the patient's current medications. Scheduled Meds: . sodium chloride  1,000 mL Intravenous Once  . aspirin EC  81 mg Oral Daily  . atorvastatin  80 mg Oral q1800  . metoprolol tartrate  12.5 mg Oral BID  .  morphine injection  4 mg Intravenous Once  . sodium chloride  3 mL Intravenous Q12H  . Ticagrelor  90  mg Oral BID   Continuous Infusions: . sodium chloride    . tirofiban 0.15 mcg/kg/min (10/04/13 0301)   PRN Meds:.sodium chloride, acetaminophen, nitroGLYCERIN, ondansetron (ZOFRAN) IV, oxyCODONE-acetaminophen, sodium chloride Assessment/Plan: #STEMI s/p DES to OM1: pt underwent LHC on 10/03/13 after presentation at [facility] with some NSVT and pt asymptomatic.  -staged PCI -ASA, BB, statin, brilinta -repeat EKG -echo pending -cardiac rehab  # CAD - residual 80% mid LAD stenosis  # Tobacco use ongoing - encouraged cessation  # Legally blind   LOS: 1 day   Clinton Gallant, MD 10/04/2013, 9:16 AM  Patient seen and examined with Dr. Algis Liming  We discussed all aspects of the encounter. I agree with the assessment and plan as stated above. He is improved this am and insisting on going home. We talked about the pros and cons about this and he understands but still wants to go home. He understands the risk of reinfarction and death. I also explained the need to f/u to address his LAD lesion. Also discussed risk of stent thrombosis if not complaint with Brilinta. A Brilinta card has been provided. Continue asa, lopressor, atorva.   F/u in 2 weeks with Dr. [name]. Cardiac rehab referral placed.    Jermey Closs,MD 10:07 AM

## 2013-10-05 ENCOUNTER — Telehealth: Payer: Self-pay | Admitting: Cardiovascular Disease

## 2013-10-05 LAB — POCT ACTIVATED CLOTTING TIME: Activated Clotting Time: 437 seconds

## 2013-10-05 NOTE — Telephone Encounter (Signed)
New message    Pt had stents put in sat at [facility] by Dr Cooper---he is to call back to schedule to have another stent put in.  He is still having pain in his rt arm when they went in to put the stent in---is this normal?

## 2013-10-05 NOTE — Telephone Encounter (Signed)
PT  AWARE  NEEDS  2 WEEK  F/U WITH  DR [name]  TRANSFERRED PHONE  CALL TO  SCHEDULERS./CY

## 2013-10-07 ENCOUNTER — Telehealth: Payer: Self-pay | Admitting: Cardiovascular Disease

## 2013-10-07 NOTE — Telephone Encounter (Signed)
Called patient to discuss work load.  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  I advised patient that I will send message to Dr. [name] for clearance and restrictions.  Patient verbalized agreement and understanding.  Patient needs clearance sent to:  Fax # [phone] ATTN:  Timmie Foerster.  Patient asked that I call him to verify that fax was sent and confirmation was received.

## 2013-10-07 NOTE — Telephone Encounter (Signed)
Per Dr. [name], patient cannot be cleared for work until he is evaluated post hospital.  I called patient and rescheduled his appointment to 2/23 with Richardson Dopp, PA-C as Dr. [name] is not in the office that week. I advised patient to have work issues addressed at that visit.  Patient verbalized understanding and gratitude.

## 2013-10-07 NOTE — Telephone Encounter (Signed)
New message         Pt would like to return to work on light duty. Is this ok?

## 2013-10-09 ENCOUNTER — Telehealth: Payer: Self-pay | Admitting: Physician Assistant

## 2013-10-09 ENCOUNTER — Encounter: Payer: Self-pay | Admitting: *Deleted

## 2013-10-09 NOTE — Telephone Encounter (Signed)
New Problem:  Pt states he has a "slight twinge" in his lower back that is annoying but not painful. Pt would like a call back from the nurse.

## 2013-10-12 ENCOUNTER — Encounter: Payer: Self-pay | Admitting: Physician Assistant

## 2013-10-12 ENCOUNTER — Encounter: Payer: Self-pay | Admitting: Nurse Practitioner

## 2013-10-12 ENCOUNTER — Ambulatory Visit (INDEPENDENT_AMBULATORY_CARE_PROVIDER_SITE_OTHER): Payer: BC Managed Care – PPO | Admitting: Physician Assistant

## 2013-10-12 VITALS — BP 130/90 | HR 68 | Ht 70.0 in | Wt 193.0 lb

## 2013-10-12 DIAGNOSIS — Z720 Tobacco use: Secondary | ICD-10-CM

## 2013-10-12 DIAGNOSIS — E785 Hyperlipidemia, unspecified: Secondary | ICD-10-CM

## 2013-10-12 DIAGNOSIS — R918 Other nonspecific abnormal finding of lung field: Secondary | ICD-10-CM

## 2013-10-12 DIAGNOSIS — I25110 Atherosclerotic heart disease of native coronary artery with unstable angina pectoris: Secondary | ICD-10-CM | POA: Insufficient documentation

## 2013-10-12 DIAGNOSIS — F17200 Nicotine dependence, unspecified, uncomplicated: Secondary | ICD-10-CM

## 2013-10-12 DIAGNOSIS — I252 Old myocardial infarction: Secondary | ICD-10-CM | POA: Insufficient documentation

## 2013-10-12 DIAGNOSIS — I2510 Atherosclerotic heart disease of native coronary artery without angina pectoris: Secondary | ICD-10-CM

## 2013-10-12 NOTE — Telephone Encounter (Signed)
Patient has appointment today with Richardson Dopp, PA-C and complaint will be addressed.

## 2013-10-12 NOTE — Patient Instructions (Addendum)
Your physician assistant recommends that you return for lab work on Monday 4/13 Fasting cholesterol and liver panel.  Do not eat or drink anything after midnight in preparation for your lab work.  Your physician assistant recommends that you continue on your current medications as directed. Please refer to the Current Medication list given to you today.  Your physician assistant recommends that you return for follow-up on Monday 4/13 with Dr. [name] at 9:15 am - get your lab work the same morning before you see Dr. [name]  Your physician assistant has requested that you have en exercise stress myoview at [facility] within the next week. For further information please visit [email or web address]. Please follow instruction sheet, as given.  Your physician assistant has requested that you have a chest xray.  This has been ordered and may be completed at [facility].

## 2013-10-12 NOTE — Progress Notes (Signed)
[address] [place], [place]  [address] Phone: [phone] Fax:  [phone]  Date:  10/12/2013   ID:  Justin Santana, DOB [birth year], MRN [id number]  PCP:  Pcp Not In System  Cardiologist:  Dr. Sherren Mocha     History of Present Illness: Justin Santana is a 48 y.o. male smoker who was admitted 2/14-2/15 with an inferior STEMI. He was taken emergently to the cardiac catheterization lab. He had severe disease in the LAD and an occluded OM1 (culprit vessel). OM1 was treated with a DES. Recommendation was to proceed with staged PCI of LAD prior to discharge.  However, the patient insisted on going home and essentially left against medical device.    LHC (10/03/13): Proximal LAD 50%, mid LAD 80%, small prox D1 90%, OM1 occluded, EF 55% with mid inferior wall HK. PCI:  Xience (3.25 x 18 mm) DES to the OM1.  Staged PCI of LAD prior to discharge as recommended.  Since d/c, he has felt well.  Denies chest pain or dyspnea.  He has been walking inside his house and going up steps.  No syncope, orthopnea, PND, edema.  He is still smoking.   Recent Labs: 10/03/2013: Pro B Natriuretic peptide (BNP) 15.4  10/04/2013: Creatinine 0.96; HDL Cholesterol 38*; Hemoglobin 15.4; LDL (calc) 164*; Potassium 4.2; TSH 0.595   Wt Readings from Last 3 Encounters:  10/12/13 193 lb (87.544 kg)  10/03/13 195 lb 15.8 oz (88.9 kg)  10/03/13 195 lb 15.8 oz (88.9 kg)     Past Medical History  Diagnosis Date  . Hypertriglyceridemia   . Nystagmus   . Blind   . Coronary artery disease   . Myocardial infarction     Current Outpatient Prescriptions  Medication Sig Dispense Refill  . aspirin EC 81 MG EC tablet Take 1 tablet (81 mg total) by mouth daily.      Marland Kitchen atorvastatin (LIPITOR) 80 MG tablet Take 1 tablet (80 mg total) by mouth daily at 6 PM.  30 tablet  5  . metoprolol tartrate (LOPRESSOR) 25 MG tablet Take 0.5 tablets (12.5 mg total) by mouth 2 (two) times daily.  60 tablet  3  .  nitroGLYCERIN (NITROSTAT) 0.4 MG SL tablet Place 1 tablet (0.4 mg total) under the tongue every 5 (five) minutes x 3 doses as needed for chest pain.  25 tablet  2  . Ticagrelor (BRILINTA) 90 MG TABS tablet Take 1 tablet (90 mg total) by mouth 2 (two) times daily.  60 tablet  10   No current facility-administered medications for this visit.    Allergies:   Aleve and Iodine   Social History:  The patient  reports that he has been smoking Cigarettes.  He has a 37 pack-year smoking history. He does not have any smokeless tobacco history on file. He reports that he does not drink alcohol or use illicit drugs.   Family History:  The patient's family history includes Heart disease in his father.   ROS:  Please see the history of present illness.      All other systems reviewed and negative.   PHYSICAL EXAM: VS:  BP 130/90  Pulse 68  Ht 5\' 10"  (1.778 m)  Wt 193 lb (87.544 kg)  BMI 27.69 kg/m2 Well nourished, well developed, in no acute distress HEENT: normal Neck: no JVD Cardiac:  normal S1, S2; RRR; no murmur Lungs:  Bibasilar crackles, no wheezes Abd: soft, nontender, no hepatomegaly Ext: no edemaright wrist without  hematoma or mass  Skin: warm and dry Neuro:  CNs 2-12 intact, no focal abnormalities noted  EKG:  NSR, HR 68, inf Q waves with assoc TWI     ASSESSMENT AND PLAN:  1. CAD, s/p Recent Inferior MI:  Doing well s/p DES to OM1 in setting of inferior STEMI.  We discussed the importance of dual antiplatelet therapy. He needed staged PCI of the LAD.  However, he left the hospital before this was done and he is not having any symptoms to suggest angina.  I reviewed with Dr. Sherren Mocha by phone.  We will arrange ETT-Myoview to assess for ischemia in the LAD territory.  If he has anterior ischemia, he will need PCI of the LAD.  If his Justin Santana is low risk, I will refer to cardiac rehab, if he is able to attend.  Continue statin.  2. Hyperlipidemia:  Continue statin.  Check Lipids and  LFTs in 6 weeks.   3. Tobacco Abuse:  He is trying to quit. His lung exam is abnormal.  I will arrange a CXR.   4. Disposition:  F/u with Dr. Sherren Mocha in 4-6 weeks.  He wants to go back to work.  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  I will give him a note to RTW 10/13/13.    Signed, Richardson Dopp, PA-C  10/12/2013 10:39 AM

## 2013-10-16 ENCOUNTER — Telehealth: Payer: Self-pay | Admitting: Cardiovascular Disease

## 2013-10-16 NOTE — Telephone Encounter (Signed)
New message    Can pt take mucinex fast max---for his cold?  He is on heart medications

## 2013-10-16 NOTE — Telephone Encounter (Signed)
Confirmed with pharmacist--pt can take any of the mucinex products--they do not interfere with any of his medications.   Informed patient who verbalizes understanding.

## 2013-10-19 ENCOUNTER — Other Ambulatory Visit: Payer: Self-pay | Admitting: *Deleted

## 2013-10-19 DIAGNOSIS — I998 Other disorder of circulatory system: Secondary | ICD-10-CM

## 2013-10-19 DIAGNOSIS — I219 Acute myocardial infarction, unspecified: Secondary | ICD-10-CM

## 2013-10-19 DIAGNOSIS — I2510 Atherosclerotic heart disease of native coronary artery without angina pectoris: Secondary | ICD-10-CM

## 2013-10-20 ENCOUNTER — Encounter (HOSPITAL_COMMUNITY)
Admission: RE | Admit: 2013-10-20 | Discharge: 2013-10-20 | Disposition: A | Discharge status: Home / Self Care | Payer: BC Managed Care – PPO | Source: Ambulatory Visit | Attending: Cardiovascular Disease | Admitting: Cardiovascular Disease

## 2013-10-20 ENCOUNTER — Encounter (HOSPITAL_COMMUNITY): Payer: Self-pay

## 2013-10-20 DIAGNOSIS — Z87891 Personal history of nicotine dependence: Secondary | ICD-10-CM | POA: Insufficient documentation

## 2013-10-20 DIAGNOSIS — E785 Hyperlipidemia, unspecified: Secondary | ICD-10-CM | POA: Insufficient documentation

## 2013-10-20 DIAGNOSIS — I2510 Atherosclerotic heart disease of native coronary artery without angina pectoris: Secondary | ICD-10-CM

## 2013-10-20 DIAGNOSIS — I219 Acute myocardial infarction, unspecified: Secondary | ICD-10-CM

## 2013-10-20 DIAGNOSIS — I2119 ST elevation (STEMI) myocardial infarction involving other coronary artery of inferior wall: Secondary | ICD-10-CM | POA: Insufficient documentation

## 2013-10-20 DIAGNOSIS — I252 Old myocardial infarction: Secondary | ICD-10-CM

## 2013-10-20 DIAGNOSIS — I998 Other disorder of circulatory system: Secondary | ICD-10-CM

## 2013-10-20 MED ORDER — SODIUM CHLORIDE 0.9 % IJ SOLN
INTRAMUSCULAR | Status: AC
  Administered 2013-10-20: 10 mL via INTRAVENOUS
  Filled 2013-10-20: qty 10

## 2013-10-20 MED ORDER — TECHNETIUM TC 99M SESTAMIBI GENERIC - CARDIOLITE
10.0000 | Freq: Once | INTRAVENOUS | Status: AC | PRN
Start: 2013-10-20 — End: 2013-10-20
  Administered 2013-10-20: 10 via INTRAVENOUS

## 2013-10-20 MED ORDER — TECHNETIUM TC 99M SESTAMIBI - CARDIOLITE
30.0000 | Freq: Once | INTRAVENOUS | Status: AC | PRN
  Administered 2013-10-20: 11:00:00 30 via INTRAVENOUS

## 2013-10-20 MED ORDER — REGADENOSON 0.4 MG/5ML IV SOLN
INTRAVENOUS | Status: AC
  Administered 2013-10-20: 0.4 mg via INTRAVENOUS
  Filled 2013-10-20: qty 5

## 2013-10-20 NOTE — Progress Notes (Signed)
Stress Lab Nurses Notes - Justin Santana  Justin Santana 10/20/2013 Reason for doing test: Post MI & ischemia evaluation Type of test: Test Changed unable to reach THR, Lexiscan given  Nurse performing test: Gerrit Halls, RN Nuclear Medicine Tech: Melburn Hake Echo Tech: Not Applicable MD performing test: S. McDowell/K.Lawrence NP Family MD: Bluth Test explained and consent signed: yes IV started: 22g jelco, Saline lock flushed, No redness or edema and Saline lock started in radiology Symptoms: SOB Treatment/Intervention: None Reason test stopped: protocol completed After recovery IV was: Discontinued via X-ray tech and No redness or edema Patient to return to [place]. Med at : 11:15 Patient discharged: Home Patient's Condition upon discharge was: stable Comments: During test BP 145/75 & HR 105.  Recovery BP 113/77 & HR 83. Symptoms resolved in recovery. [name] T

## 2013-10-21 ENCOUNTER — Encounter: Payer: Self-pay | Admitting: Physician Assistant

## 2013-10-28 ENCOUNTER — Encounter: Payer: BC Managed Care – PPO | Admitting: Cardiovascular Disease

## 2013-11-02 ENCOUNTER — Other Ambulatory Visit: Payer: Self-pay | Admitting: *Deleted

## 2013-11-02 MED ORDER — METOPROLOL TARTRATE 25 MG PO TABS: 12.5000 mg | ORAL_TABLET | Freq: Two times a day (BID) | ORAL | Status: DC

## 2013-11-02 MED ORDER — TICAGRELOR 90 MG PO TABS: 90.0000 mg | ORAL_TABLET | Freq: Two times a day (BID) | ORAL | Status: DC

## 2013-11-02 MED ORDER — ATORVASTATIN CALCIUM 80 MG PO TABS: 80.0000 mg | ORAL_TABLET | Freq: Every day | ORAL | Status: DC

## 2013-11-19 ENCOUNTER — Encounter: Payer: Self-pay | Admitting: Cardiology

## 2013-11-19 ENCOUNTER — Telehealth: Payer: Self-pay | Admitting: Cardiology

## 2013-11-19 NOTE — Telephone Encounter (Signed)
Message copied by Lewayne Bunting on Thu Nov 19, 2013 10:37 AM ------      Message from: Cher Nakai R      Created: Thu Nov 19, 2013  9:59 AM       WILL start seeing Dr [name] this month       Please contact patient about doing labs at [facility] ------

## 2013-11-19 NOTE — Telephone Encounter (Signed)
Called and informed pt that he can go to lab at [facility] Monday through Friday to have labs done 8AM-5PM. Pt verbalized understanding and requested that I mail him his lab orders.

## 2013-11-26 ENCOUNTER — Encounter: Payer: Self-pay | Admitting: Cardiology

## 2013-11-30 ENCOUNTER — Ambulatory Visit: Payer: BC Managed Care – PPO | Admitting: Cardiovascular Disease

## 2013-11-30 ENCOUNTER — Ambulatory Visit (INDEPENDENT_AMBULATORY_CARE_PROVIDER_SITE_OTHER): Payer: BC Managed Care – PPO | Admitting: Cardiology

## 2013-11-30 ENCOUNTER — Other Ambulatory Visit: Payer: BC Managed Care – PPO

## 2013-11-30 ENCOUNTER — Encounter: Payer: Self-pay | Admitting: Cardiology

## 2013-11-30 VITALS — BP 115/77 | HR 73 | Ht 70.0 in | Wt 194.0 lb

## 2013-11-30 DIAGNOSIS — E785 Hyperlipidemia, unspecified: Secondary | ICD-10-CM

## 2013-11-30 DIAGNOSIS — I2510 Atherosclerotic heart disease of native coronary artery without angina pectoris: Secondary | ICD-10-CM

## 2013-11-30 MED ORDER — TICAGRELOR 90 MG PO TABS: 90.0000 mg | ORAL_TABLET | Freq: Two times a day (BID) | ORAL | Status: DC

## 2013-11-30 MED ORDER — ATORVASTATIN CALCIUM 80 MG PO TABS: 80.0000 mg | ORAL_TABLET | Freq: Every day | ORAL | Status: DC

## 2013-11-30 MED ORDER — METOPROLOL TARTRATE 25 MG PO TABS: 12.5000 mg | ORAL_TABLET | Freq: Two times a day (BID) | ORAL | Status: DC

## 2013-11-30 NOTE — Patient Instructions (Signed)
Continue all current medications. Your physician wants you to follow up in:  3 months.  You will receive a reminder letter in the mail one-two months in advance.  If you don't receive a letter, please call our office to schedule the follow up appointment

## 2013-11-30 NOTE — Progress Notes (Signed)
Clinical Summary [name] is a 48 y.o.male seen today for hospital follow up for the following medical problems.  1. CAD - admit 09/2013 with AMI, found to have totally occluded OM1 along with severe prox/mid LAD disease. OM1 received a DES. Recommendations were for staged procedure to LAD however the patient had pressing social issues regarding custody hearing and requested discharge.  - LVEF 55% by LV gram at that time, has not had an echo - denies any recent chest pain. No DOE or SOB.  - compliant with meds - recent follow up with PA Kathlen Mody who ordered Lexiscan, no evidence of ischemia, overall low risk study.   2. Hyperlipidemia - 09/2013 TC 220 TG 88 HDL 38 LDL 164 - newly started on statin during 09/2013 admission.     Past Medical History  Diagnosis Date  . Hypertriglyceridemia   . Nystagmus   . Blind   . Coronary artery disease   . Myocardial infarction   . Hx of cardiovascular stress test     ETT/Lexiscan Myoview (10/2013):  diaph atten vs inf scar, no ischemia, EF 56%.     Allergies  Allergen Reactions  . Aleve [Naproxen Sodium] Other (See Comments)    Sweating, difficulty breathing  . Iodine Other (See Comments)    Blistering of skin     Current Outpatient Prescriptions  Medication Sig Dispense Refill  . aspirin EC 81 MG EC tablet Take 1 tablet (81 mg total) by mouth daily.      Marland Kitchen atorvastatin (LIPITOR) 80 MG tablet Take 1 tablet (80 mg total) by mouth daily at 6 PM.  30 tablet  3  . metoprolol tartrate (LOPRESSOR) 25 MG tablet Take 0.5 tablets (12.5 mg total) by mouth 2 (two) times daily.  30 tablet  3  . nitroGLYCERIN (NITROSTAT) 0.4 MG SL tablet Place 1 tablet (0.4 mg total) under the tongue every 5 (five) minutes x 3 doses as needed for chest pain.  25 tablet  2  . Ticagrelor (BRILINTA) 90 MG TABS tablet Take 1 tablet (90 mg total) by mouth 2 (two) times daily.  60 tablet  3   No current facility-administered medications for this visit.     Past  Surgical History  Procedure Laterality Date  . Knee surgery    . Eye surgery    . Elbow surgery       Allergies  Allergen Reactions  . Aleve [Naproxen Sodium] Other (See Comments)    Sweating, difficulty breathing  . Iodine Other (See Comments)    Blistering of skin      Family History  Problem Relation Age of Onset  . Heart disease Father     Pacemaker     Social History [name] reports that he has been smoking Cigarettes.  He has a 37 pack-year smoking history. He does not have any smokeless tobacco history on file. [name] reports that he does not drink alcohol.   Review of Systems CONSTITUTIONAL: No weight loss, fever, chills, weakness or fatigue.  HEENT: Eyes: No visual loss, blurred vision, double vision or yellow sclerae.No hearing loss, sneezing, congestion, runny nose or sore throat.  SKIN: No rash or itching.  CARDIOVASCULAR: per HPI RESPIRATORY: No shortness of breath, cough or sputum.  GASTROINTESTINAL: No anorexia, nausea, vomiting or diarrhea. No abdominal pain or blood.  GENITOURINARY: No burning on urination, no polyuria NEUROLOGICAL: No headache, dizziness, syncope, paralysis, ataxia, numbness or tingling in the extremities. No change in bowel or bladder  control.  MUSCULOSKELETAL: No muscle, back pain, joint pain or stiffness.  LYMPHATICS: No enlarged nodes. No history of splenectomy.  PSYCHIATRIC: No history of depression or anxiety.  ENDOCRINOLOGIC: No reports of sweating, cold or heat intolerance. No polyuria or polydipsia.  Marland Kitchen   Physical Examination p 73 bp 115/77 Wt 194 BMI 28 Gen: resting comfortably, no acute distress HEENT: no scleral icterus, pupils equal round and reactive, no palptable cervical adenopathy,  CV: RRR, no m/r/g, no JVD, no carotid bruits Resp: Clear to auscultation bilaterally GI: abdomen is soft, non-tender, non-distended, normal bowel sounds, no hepatosplenomegaly MSK: extremities are warm, no edema.  Skin: warm,  no rash Neuro:  no focal deficits Psych: appropriate affect   Diagnostic Studies 09/2012 Cath PROCEDURAL FINDINGS  Hemodynamics:  AO 115/65  LV 113/18  Coronary angiography:  Coronary dominance: right  Left mainstem: Arises from the left cusp. Widely patent without stenosis. Divides into the LAD and LCx.  Left anterior descending (LAD): The proximal LAD has diffuse 50% stenosis leading into an 80% mid-stenosis. The mid and distal LAD are patent. The first diagonal is small and there is a 90% stenosis in the proximal vessel.  Left circumflex (LCx): The AV circumflex is patent. The first OM is occluded with contrast staining suggestive of acute closure. The second OM is patent.  Right coronary artery (RCA): Large, dominant vessel without significant disease. There is diffuse irregularity before the bifurcation of the PDA and PLA branches.  Left ventriculography:The mid-inferior wall is hypokinetic, but the LVEF is fairly preserved and estimated at 55%, there is no significant mitral regurgitation  PCI Note: Following the diagnostic procedure, the decision was made to proceed with PCI. Weight-based bivalirudin was given for anticoagulation. Once a therapeutic ACT was achieved, a 6 [place] XB 3.5 guide catheter was inserted. A cougar coronary guidewire was used to cross the lesion in the first OM. The lesion was predilated with a 2.5 mm balloon. The lesion was then stented with a 3.25 x 18 mm Xience Alpine DES stent. The stent was postdilated with a 3.5 mm noncompliant balloon. Following PCI, there was 0% residual stenosis and TIMI-3 flow. Final angiography confirmed an excellent result. The patient tolerated the procedure well. There were no immediate procedural complications. A TR band was used for radial hemostasis. The patient was transferred to the post catheterization recovery area for further monitoring.  PCI Data:  Vessel - OM1  Percent Stenosis (pre) 100  TIMI-flow 0  Stent 3.25 x 18 mm  Xience DES  Percent Stenosis (post) 0  TIMI-flow (post) 3  Final Conclusions:  1. Total occlusion of the OM1 treated successfully with primary PCI (DES platform)  2. Severe proximal/mid LAD stenosis  3. Patent, dominant RCA with minor nonobstructive disease  4. Mild segmental LV dysfunction    Assessment and Plan  1. CAD - recent STEMI, with DES to OM - plan at that time was staged intervention to 80% LAD lesion, however patient needed to leave hospital for socials reasons prior - recent MPI shows no ischemia, LVEF was normal by LV gram and nuclear - denies any recent chest pain or SOB - will touch base with Dr [name] regarding his recs regarding the remaining mid LAD lesion - continue DAPT, brilliinta at least until 09/2014.   2. Hyperlipidemia - fairly recently started on statin, he reports recent labs at [facility]. Will follow up results.     F/u 3 months  Arnoldo Lenis, M.D., F.A.C.C.

## 2013-12-03 ENCOUNTER — Telehealth: Payer: Self-pay | Admitting: Cardiology

## 2013-12-03 NOTE — Telephone Encounter (Signed)
Message copied by Lewayne Bunting on Thu Dec 03, 2013  9:36 AM ------      Message from: [name] F      Created: Tue Dec 01, 2013  8:51 AM       Can you let patient know that I discussed with Dr [name] his remaining blockage. He feels since the patient has no symptoms and the stress test was negative that we continue medications only at this time, and not open up that other blockage at this time. i agree.                  [name] MD ------

## 2013-12-03 NOTE — Telephone Encounter (Signed)
LM for pt to returncall

## 2013-12-03 NOTE — Telephone Encounter (Signed)
Patient returned your call. He is available now was in a meeting earlier

## 2013-12-03 NOTE — Telephone Encounter (Signed)
Pt informed and verbalized understanding

## 2013-12-03 NOTE — Telephone Encounter (Signed)
LM to return call.

## 2013-12-04 ENCOUNTER — Other Ambulatory Visit: Payer: Self-pay | Admitting: Cardiology

## 2013-12-04 DIAGNOSIS — E785 Hyperlipidemia, unspecified: Secondary | ICD-10-CM

## 2013-12-04 DIAGNOSIS — I2510 Atherosclerotic heart disease of native coronary artery without angina pectoris: Secondary | ICD-10-CM

## 2013-12-04 DIAGNOSIS — I252 Old myocardial infarction: Secondary | ICD-10-CM

## 2013-12-07 ENCOUNTER — Telehealth: Payer: Self-pay | Admitting: Cardiology

## 2013-12-07 NOTE — Telephone Encounter (Signed)
Pt informed of results and verbalized understanding.  

## 2013-12-07 NOTE — Telephone Encounter (Signed)
Message copied by Lewayne Bunting on Mon Dec 07, 2013  4:37 PM ------      Message from: [place] F      Created: Mon Dec 07, 2013  2:45 PM       Please let patient know that cholesterol looks ok                  [name] MD ------

## 2014-02-22 ENCOUNTER — Encounter: Payer: Self-pay | Admitting: Cardiology

## 2014-02-22 ENCOUNTER — Ambulatory Visit (INDEPENDENT_AMBULATORY_CARE_PROVIDER_SITE_OTHER): Payer: BC Managed Care – PPO | Admitting: Cardiology

## 2014-02-22 VITALS — BP 115/73 | HR 74 | Ht 70.0 in | Wt 198.4 lb

## 2014-02-22 DIAGNOSIS — I2510 Atherosclerotic heart disease of native coronary artery without angina pectoris: Secondary | ICD-10-CM

## 2014-02-22 DIAGNOSIS — E785 Hyperlipidemia, unspecified: Secondary | ICD-10-CM

## 2014-02-22 MED ORDER — METOPROLOL TARTRATE 25 MG PO TABS: 12.5000 mg | ORAL_TABLET | Freq: Two times a day (BID) | ORAL | Status: DC

## 2014-02-22 MED ORDER — TICAGRELOR 90 MG PO TABS: 90.0000 mg | ORAL_TABLET | Freq: Two times a day (BID) | ORAL | Status: DC

## 2014-02-22 MED ORDER — ATORVASTATIN CALCIUM 80 MG PO TABS: 80.0000 mg | ORAL_TABLET | Freq: Every day | ORAL | Status: DC

## 2014-02-22 NOTE — Patient Instructions (Signed)
Continue all medication. Your physician wants you to follow up in:  1 year.  You will receive a reminder letter in the mail one-two months in advance.  If you don't receive a letter, please call our office to schedule the follow up appointment

## 2014-02-22 NOTE — Progress Notes (Signed)
Clinical Summary Justin Santana is a 48 y.o.male seen today for follow up of the following medical problems.   1. CAD  - admit 09/2013 with AMI, found to have totally occluded OM1 along with severe prox/mid LAD disease. OM1 received a DES. Recommendations were for staged procedure to LAD however the patient had pressing social issues regarding custody hearing and requested discharge. - 10/2013 MPI low risk study, inferior scar vs subdiaphragmatic attenuation, no anterior defect - I have since discussed case with Dr [name], given he is asymptomatic with negative stress test we have elected not to pursue the staged procedure and treat medically.    - denies any recent chest pain. No DOE or SOB. Does heavy yard work regularly without limitations - compliant with meds      2. Hyperlipidemia  - 09/2013 TC 220 TG 88 HDL 38 LDL 164  -repeat panel 11/2013 panel TC 128 HDL 30 LDL 75 TG 114 - newly started on statin during 09/2013 admission.    Past Medical History  Diagnosis Date  . Hypertriglyceridemia   . Nystagmus   . Blind   . Coronary artery disease   . Myocardial infarction   . Hx of cardiovascular stress test     ETT/Lexiscan Myoview (10/2013):  diaph atten vs inf scar, no ischemia, EF 56%.     Allergies  Allergen Reactions  . Aleve [Naproxen Sodium] Other (See Comments)    Sweating, difficulty breathing  . Iodine Other (See Comments)    Blistering of skin     Current Outpatient Prescriptions  Medication Sig Dispense Refill  . aspirin EC 81 MG EC tablet Take 1 tablet (81 mg total) by mouth daily.      Marland Kitchen atorvastatin (LIPITOR) 80 MG tablet Take 1 tablet (80 mg total) by mouth daily at 6 PM.  90 tablet  3  . metoprolol tartrate (LOPRESSOR) 25 MG tablet Take 0.5 tablets (12.5 mg total) by mouth 2 (two) times daily.  90 tablet  3  . nitroGLYCERIN (NITROSTAT) 0.4 MG SL tablet Place 1 tablet (0.4 mg total) under the tongue every 5 (five) minutes x 3 doses as needed for chest pain.   25 tablet  2  . Ticagrelor (BRILINTA) 90 MG TABS tablet Take 1 tablet (90 mg total) by mouth 2 (two) times daily.  180 tablet  3   No current facility-administered medications for this visit.     Past Surgical History  Procedure Laterality Date  . Knee surgery    . Eye surgery    . Elbow surgery       Allergies  Allergen Reactions  . Aleve [Naproxen Sodium] Other (See Comments)    Sweating, difficulty breathing  . Iodine Other (See Comments)    Blistering of skin      Family History  Problem Relation Age of Onset  . Heart disease Father     Pacemaker     Social History [name] reports that he has been smoking Cigarettes.  He has a 37 pack-year smoking history. He does not have any smokeless tobacco history on file. [name] reports that he does not drink alcohol.   Review of Systems CONSTITUTIONAL: No weight loss, fever, chills, weakness or fatigue.  HEENT: Eyes: No visual loss, blurred vision, double vision or yellow sclerae.No hearing loss, sneezing, congestion, runny nose or sore throat.  SKIN: No rash or itching.  CARDIOVASCULAR: per HPI RESPIRATORY: No shortness of breath, cough or sputum.  GASTROINTESTINAL: No anorexia,  nausea, vomiting or diarrhea. No abdominal pain or blood.  GENITOURINARY: No burning on urination, no polyuria NEUROLOGICAL: No headache, dizziness, syncope, paralysis, ataxia, numbness or tingling in the extremities. No change in bowel or bladder control.  MUSCULOSKELETAL: No muscle, back pain, joint pain or stiffness.  LYMPHATICS: No enlarged nodes. No history of splenectomy.  PSYCHIATRIC: No history of depression or anxiety.  ENDOCRINOLOGIC: No reports of sweating, cold or heat intolerance. No polyuria or polydipsia.  Marland Kitchen   Physical Examination p 74 bp 115/73 Wt 198 lbs BMI 28 Gen: resting comfortably, no acute distress HEENT: no scleral icterus, pupils equal round and reactive, no palptable cervical adenopathy,  CV: RRR, no  m/r/g, no JVD Resp: Clear to auscultation bilaterally GI: abdomen is soft, non-tender, non-distended, normal bowel sounds, no hepatosplenomegaly MSK: extremities are warm, no edema.  Skin: warm, no rash Neuro:  no focal deficits Psych: appropriate affect   Diagnostic Studies 09/2012 Cath  PROCEDURAL FINDINGS  Hemodynamics:  AO 115/65  LV 113/18  Coronary angiography:  Coronary dominance: right  Left mainstem: Arises from the left cusp. Widely patent without stenosis. Divides into the LAD and LCx.  Left anterior descending (LAD): The proximal LAD has diffuse 50% stenosis leading into an 80% mid-stenosis. The mid and distal LAD are patent. The first diagonal is small and there is a 90% stenosis in the proximal vessel.  Left circumflex (LCx): The AV circumflex is patent. The first OM is occluded with contrast staining suggestive of acute closure. The second OM is patent.  Right coronary artery (RCA): Large, dominant vessel without significant disease. There is diffuse irregularity before the bifurcation of the PDA and PLA branches.  Left ventriculography:The mid-inferior wall is hypokinetic, but the LVEF is fairly preserved and estimated at 55%, there is no significant mitral regurgitation  PCI Note: Following the diagnostic procedure, the decision was made to proceed with PCI. Weight-based bivalirudin was given for anticoagulation. Once a therapeutic ACT was achieved, a 6 [place] XB 3.5 guide catheter was inserted. A cougar coronary guidewire was used to cross the lesion in the first OM. The lesion was predilated with a 2.5 mm balloon. The lesion was then stented with a 3.25 x 18 mm Xience Alpine DES stent. The stent was postdilated with a 3.5 mm noncompliant balloon. Following PCI, there was 0% residual stenosis and TIMI-3 flow. Final angiography confirmed an excellent result. The patient tolerated the procedure well. There were no immediate procedural complications. A TR band was used for radial  hemostasis. The patient was transferred to the post catheterization recovery area for further monitoring.  PCI Data:  Vessel - OM1  Percent Stenosis (pre) 100  TIMI-flow 0  Stent 3.25 x 18 mm Xience DES  Percent Stenosis (post) 0  TIMI-flow (post) 3  Final Conclusions:  1. Total occlusion of the OM1 treated successfully with primary PCI (DES platform)  2. Severe proximal/mid LAD stenosis  3. Patent, dominant RCA with minor nonobstructive disease  4. Mild segmental LV dysfunction     Assessment and Plan  1. CAD  - recent STEMI, with DES to OM  - plan at that time was staged intervention to 80% LAD lesion, however patient needed to leave hospital for socials reasons prior  - recent MPI shows no ischemia, LVEF was normal by LV gram and nuclear. He remains asymptomatic. Discussed case with Dr [name], continue medical management, will not pursue staged procedure at this time.  - continue DAPT, brilliinta at least until 09/2014.   2.  Hyperlipidemia  - at goal on current statin, continue current therapy    F/u 6 months   Arnoldo Lenis, M.D., F.A.C.C.

## 2014-03-03 ENCOUNTER — Ambulatory Visit: Payer: BC Managed Care – PPO | Admitting: Cardiology

## 2014-07-29 ENCOUNTER — Encounter (HOSPITAL_COMMUNITY): Payer: Self-pay | Admitting: Cardiology

## 2014-08-06 ENCOUNTER — Telehealth: Payer: Self-pay | Admitting: Cardiology

## 2014-08-06 NOTE — Telephone Encounter (Signed)
Please confirm when patient is suppose to return for a follow up.

## 2014-08-06 NOTE — Telephone Encounter (Signed)
Per Dr. [name] last [facility] note 02/22/2014 -   1. CAD  - recent STEMI, with DES to OM  - plan at that time was staged intervention to 80% LAD lesion, however patient needed to leave hospital for socials reasons prior  - recent MPI shows no ischemia, LVEF was normal by LV gram and nuclear. He remains asymptomatic. Discussed case with Dr [name], continue medical management, will not pursue staged procedure at this time.  - continue DAPT, brilliinta at least until 09/2014.   Attempted to notify patient - left info on voice mail & asked for return call back on Monday confirming that he received information.

## 2014-08-10 NOTE — Telephone Encounter (Signed)
No return call to present regarding below message.    Attempted to return call to confirm that patient received message.    Left message for patient to call back if he had any further questions.

## 2014-09-15 ENCOUNTER — Telehealth: Payer: Self-pay | Admitting: Cardiology

## 2014-09-15 NOTE — Telephone Encounter (Signed)
His stent was placed 10/03/13. He should continue the brillinta until 10/03/14 and then it is ok to stop. Do we have any samples we can give him if he needs it?   [name] MD

## 2014-09-15 NOTE — Telephone Encounter (Signed)
Will forward to Dr. [name] as last note states for pt to stay on Brilinta 90 mg until 09/2014.

## 2014-09-15 NOTE — Telephone Encounter (Signed)
Justin Santana called stating that he has been on ticagrelor (BRILINTA) 90 MG TABS tablet for one year. States that he was told that after one year He could come off this medication. With insurance now they are going to charge him $100.00 for a month supply. Please advise patient.

## 2014-09-15 NOTE — Telephone Encounter (Signed)
Pt has enough brilinta to last until end of February will d/c at that time. Pt is wanting clarity on f/u or testing needed, concerned about noted blockage and preventive measures. Will forward back to Dr. [name]. Pt is scheduled to be seen in July 2016

## 2014-09-16 NOTE — Telephone Encounter (Signed)
Lets change his follow up for the end of February to talk about everything in detail   [name] MD

## 2014-09-16 NOTE — Telephone Encounter (Signed)
Pt made aware, scheduled for 10/19/14 with Dr. [name]. Will forward to Dr. [name] as Juluis Rainier

## 2014-10-19 ENCOUNTER — Ambulatory Visit: Payer: Medicare Other | Admitting: Cardiology

## 2014-11-09 ENCOUNTER — Ambulatory Visit (INDEPENDENT_AMBULATORY_CARE_PROVIDER_SITE_OTHER): Payer: BC Managed Care – PPO | Admitting: Cardiology

## 2014-11-09 ENCOUNTER — Encounter: Payer: Self-pay | Admitting: Cardiology

## 2014-11-09 VITALS — BP 118/76 | HR 72 | Ht 70.0 in | Wt 203.1 lb

## 2014-11-09 DIAGNOSIS — E785 Hyperlipidemia, unspecified: Secondary | ICD-10-CM

## 2014-11-09 DIAGNOSIS — I2510 Atherosclerotic heart disease of native coronary artery without angina pectoris: Secondary | ICD-10-CM | POA: Diagnosis not present

## 2014-11-09 DIAGNOSIS — Z131 Encounter for screening for diabetes mellitus: Secondary | ICD-10-CM

## 2014-11-09 DIAGNOSIS — I252 Old myocardial infarction: Secondary | ICD-10-CM | POA: Diagnosis not present

## 2014-11-09 DIAGNOSIS — R739 Hyperglycemia, unspecified: Secondary | ICD-10-CM

## 2014-11-09 DIAGNOSIS — R7309 Other abnormal glucose: Secondary | ICD-10-CM

## 2014-11-09 MED ORDER — LISINOPRIL 2.5 MG PO TABS: 2.5000 mg | ORAL_TABLET | Freq: Every day | ORAL | Status: DC

## 2014-11-09 NOTE — Patient Instructions (Signed)
Your physician recommends that you schedule a follow-up appointment in: 4 months with Dr. [name]  Your physician has recommended you make the following change in your medication:   START LISINOPRIL 2.5 MG DAILY  CONTINUE ALL OTHER MEDICATIONS AS DIRECTED  Your physician recommends that you return for lab work JUST PRIOR TO YOUR NEXT VISIT CBC/CMP/LIPIDS/HGBA1C  Thank you for choosing [facility]!!

## 2014-11-09 NOTE — Progress Notes (Signed)
Clinical Summary Justin Santana is a 49 y.o.male seen today for follow up of the following medical problems.   1. CAD  - admit 09/2013 with AMI, found to have totally occluded OM1 along with severe prox/mid LAD disease. OM1 received a DES. Recommendations were for staged procedure to LAD however the patient had pressing social issues regarding custody hearing and requested discharge. - 10/2013 MPI low risk study, inferior scar vs subdiaphragmatic attenuation, no anterior defect - I have since discussed case with Dr [name], given he is asymptomatic with negative stress test we have elected not to pursue the staged procedure and treat medically.   - denies any recent chest pain. No DOE or SOB.  - compliant with meds     2. Hyperlipidemia   panel 11/2013 panel TC 128 HDL 30 LDL 75 TG 114 - compliant with statin   Past Medical History  Diagnosis Date  . Hypertriglyceridemia   . Nystagmus   . Blind   . Coronary artery disease   . Myocardial infarction   . Hx of cardiovascular stress test     ETT/Lexiscan Myoview (10/2013):  diaph atten vs inf scar, no ischemia, EF 56%.     Allergies  Allergen Reactions  . Aleve [Naproxen Sodium] Other (See Comments)    Sweating, difficulty breathing  . Iodine Other (See Comments)    Blistering of skin     Current Outpatient Prescriptions  Medication Sig Dispense Refill  . aspirin EC 81 MG EC tablet Take 1 tablet (81 mg total) by mouth daily.    Marland Kitchen atorvastatin (LIPITOR) 80 MG tablet Take 1 tablet (80 mg total) by mouth daily at 6 PM. 90 tablet 3  . metoprolol tartrate (LOPRESSOR) 25 MG tablet Take 0.5 tablets (12.5 mg total) by mouth 2 (two) times daily. 90 tablet 3  . nitroGLYCERIN (NITROSTAT) 0.4 MG SL tablet Place 1 tablet (0.4 mg total) under the tongue every 5 (five) minutes x 3 doses as needed for chest pain. 25 tablet 2  . ticagrelor (BRILINTA) 90 MG TABS tablet Take 1 tablet (90 mg total) by mouth 2 (two) times daily. 180 tablet  3   No current facility-administered medications for this visit.     Past Surgical History  Procedure Laterality Date  . Knee surgery    . Eye surgery    . Elbow surgery    . Left heart catheterization with coronary angiogram N/A 10/03/2013    Procedure: LEFT HEART CATHETERIZATION WITH CORONARY ANGIOGRAM;  Surgeon: [name], MD;  Location: [facility] CATH LAB;  Service: Cardiovascular;  Laterality: N/A;     Allergies  Allergen Reactions  . Aleve [Naproxen Sodium] Other (See Comments)    Sweating, difficulty breathing  . Iodine Other (See Comments)    Blistering of skin      Family History  Problem Relation Age of Onset  . Heart disease Father     Pacemaker     Social History Justin Santana reports that he has been smoking Cigarettes.  He has a 37 pack-year smoking history. He does not have any smokeless tobacco history on file. Justin Santana reports that he does not drink alcohol.   Review of Systems CONSTITUTIONAL: No weight loss, fever, chills, weakness or fatigue.  HEENT: Eyes: No visual loss, blurred vision, double vision or yellow sclerae.No hearing loss, sneezing, congestion, runny nose or sore throat.  SKIN: No rash or itching.  CARDIOVASCULAR: per HPI RESPIRATORY: No shortness of breath, cough or sputum.  GASTROINTESTINAL:  No anorexia, nausea, vomiting or diarrhea. No abdominal pain or blood.  GENITOURINARY: No burning on urination, no polyuria NEUROLOGICAL: No headache, dizziness, syncope, paralysis, ataxia, numbness or tingling in the extremities. No change in bowel or bladder control.  MUSCULOSKELETAL: No muscle, back pain, joint pain or stiffness.  LYMPHATICS: No enlarged nodes. No history of splenectomy.  PSYCHIATRIC: No history of depression or anxiety.  ENDOCRINOLOGIC: No reports of sweating, cold or heat intolerance. No polyuria or polydipsia.  Marland Kitchen   Physical Examination p 72 bp 118/76 Wt 203 lbs BMI 29 Gen: resting comfortably, no acute distress HEENT:  no scleral icterus, pupils equal round and reactive, no palptable cervical adenopathy,  CV: RRR, no m/r/g, no JVD, no carotid bruits Resp: Clear to auscultation bilaterally GI: abdomen is soft, non-tender, non-distended, normal bowel sounds, no hepatosplenomegaly MSK: extremities are warm, no edema.  Skin: warm, no rash Neuro:  no focal deficits Psych: appropriate affect   Diagnostic Studies 09/2012 Cath  PROCEDURAL FINDINGS  Hemodynamics:  AO 115/65  LV 113/18  Coronary angiography:  Coronary dominance: right  Left mainstem: Arises from the left cusp. Widely patent without stenosis. Divides into the LAD and LCx.  Left anterior descending (LAD): The proximal LAD has diffuse 50% stenosis leading into an 80% mid-stenosis. The mid and distal LAD are patent. The first diagonal is small and there is a 90% stenosis in the proximal vessel.  Left circumflex (LCx): The AV circumflex is patent. The first OM is occluded with contrast staining suggestive of acute closure. The second OM is patent.  Right coronary artery (RCA): Large, dominant vessel without significant disease. There is diffuse irregularity before the bifurcation of the PDA and PLA branches.  Left ventriculography:The mid-inferior wall is hypokinetic, but the LVEF is fairly preserved and estimated at 55%, there is no significant mitral regurgitation  PCI Note: Following the diagnostic procedure, the decision was made to proceed with PCI. Weight-based bivalirudin was given for anticoagulation. Once a therapeutic ACT was achieved, a 6 [place] XB 3.5 guide catheter was inserted. A cougar coronary guidewire was used to cross the lesion in the first OM. The lesion was predilated with a 2.5 mm balloon. The lesion was then stented with a 3.25 x 18 mm Xience Alpine DES stent. The stent was postdilated with a 3.5 mm noncompliant balloon. Following PCI, there was 0% residual stenosis and TIMI-3 flow. Final angiography confirmed an  excellent result. The patient tolerated the procedure well. There were no immediate procedural complications. A TR band was used for radial hemostasis. The patient was transferred to the post catheterization recovery area for further monitoring.  PCI Data:  Vessel - OM1  Percent Stenosis (pre) 100  TIMI-flow 0  Stent 3.25 x 18 mm Xience DES  Percent Stenosis (post) 0  TIMI-flow (post) 3  Final Conclusions:  1. Total occlusion of the OM1 treated successfully with primary PCI (DES platform)  2. Severe proximal/mid LAD stenosis  3. Patent, dominant RCA with minor nonobstructive disease  4. Mild segmental LV dysfunction     Assessment and Plan  1. CAD  - recent STEMI, with DES to OM  - plan at that time was staged intervention to 80% LAD lesion, however patient needed to leave hospital for social reasons prior  - recent MPI shows no ischemia, LVEF was normal by LV gram and nuclear. He remains asymptomatic. Discussed case with Dr [name] last visit, continue medical management, will not pursue staged procedure at this time.  - continue medical therapy.  Start lisinopril 2.5mg  daily for additional cardiovascular outcome benefits, check labs in 2 weeks  2. Hyperlipidemia  - at goal on current statin, continue current therapy - repeat lipid panel    F/u 4 months  Arnoldo Lenis, M.D.

## 2014-11-12 ENCOUNTER — Telehealth: Payer: Self-pay | Admitting: *Deleted

## 2014-11-12 NOTE — Telephone Encounter (Signed)
-----   Message from Arnoldo Lenis, MD sent at 11/12/2014  2:35 PM EDT ----- Please let patient know that I spoke with Dr [name] and he agreed with continuing medications for now, there is no indication for another stent at this time.  [name] MD ----- Message -----    From: Sherren Mocha, MD    Sent: 11/11/2014   3:32 PM      To: Arnoldo Lenis, MD  Totally agree.  ----- Message -----    From: Arnoldo Lenis, MD    Sent: 11/11/2014   9:59 AM      To: Sherren Mocha, MD  Candice Camp, This is a patient of mine you stented about a year ago. You considered doing a staged procedure on the LAD, but he had to be discharged for personal reasons. We talked about him a few months ago, and due to absence of symptoms and negative MPI for ischemia of that territory decided on continued medical therapy. He continues to do well but remains apprehensive about the remaining blockage and asked me to touch base with you on your thoughts. My plan would be continued medical therapy   JB

## 2014-11-12 NOTE — Telephone Encounter (Signed)
Pt made aware

## 2015-01-21 ENCOUNTER — Other Ambulatory Visit: Payer: Self-pay | Admitting: *Deleted

## 2015-01-21 MED ORDER — ATORVASTATIN CALCIUM 80 MG PO TABS: 80.0000 mg | ORAL_TABLET | Freq: Every day | ORAL | Status: DC

## 2015-01-21 MED ORDER — METOPROLOL TARTRATE 25 MG PO TABS: 12.5000 mg | ORAL_TABLET | Freq: Two times a day (BID) | ORAL | Status: DC

## 2015-01-21 MED ORDER — LISINOPRIL 2.5 MG PO TABS: 2.5000 mg | ORAL_TABLET | Freq: Every day | ORAL | Status: DC

## 2015-01-21 MED ORDER — NITROGLYCERIN 0.4 MG SL SUBL: 0.4000 mg | SUBLINGUAL_TABLET | SUBLINGUAL | Status: DC | PRN

## 2015-01-21 NOTE — Telephone Encounter (Signed)
Pt says he is being laid off end of June and would like 90 day refills of heart medicine sent while he has insurance. Pt dues to f/u in July. Medication sent to pharmacy. LM for pt to call back to schedule f/u appt prior to lay off

## 2015-05-27 ENCOUNTER — Encounter: Payer: Self-pay | Admitting: Cardiology

## 2015-05-27 ENCOUNTER — Encounter: Payer: Self-pay | Admitting: *Deleted

## 2015-05-27 ENCOUNTER — Ambulatory Visit (INDEPENDENT_AMBULATORY_CARE_PROVIDER_SITE_OTHER): Payer: BLUE CROSS/BLUE SHIELD | Admitting: Cardiology

## 2015-05-27 VITALS — BP 106/72 | HR 69 | Ht 70.0 in | Wt 193.8 lb

## 2015-05-27 DIAGNOSIS — E785 Hyperlipidemia, unspecified: Secondary | ICD-10-CM | POA: Diagnosis not present

## 2015-05-27 DIAGNOSIS — I2510 Atherosclerotic heart disease of native coronary artery without angina pectoris: Secondary | ICD-10-CM

## 2015-05-27 MED ORDER — LOSARTAN POTASSIUM 25 MG PO TABS: 12.5000 mg | ORAL_TABLET | Freq: Every day | ORAL | Status: DC

## 2015-05-27 NOTE — Patient Instructions (Signed)
Your physician wants you to follow-up in: [place] DR. BRANCH You will receive a reminder letter in the mail two months in advance. If you don't receive a letter, please call our office to schedule the follow-up appointment.  Your physician has recommended you make the following change in your medication:   STOP LISINOPRIL   START LOSARTAN 12.5 MG DAILY  WE WILL REQUEST LABS  Thank you for choosing [facility]!!

## 2015-05-27 NOTE — Progress Notes (Signed)
Patient ID: Justin Santana, male   DOB: 03-May-1966, 49 y.o.   MRN: [id number]     Clinical Summary Justin Santana is a 49 y.o.male seen today for follow up of the following medical problems.   1. CAD  - admit 09/2013 with AMI, found to have totally occluded OM1 along with severe prox/mid LAD disease. OM1 received a DES. Recommendations were for staged procedure to LAD however the patient had pressing social issues regarding custody hearing and requested discharge. - 10/2013 MPI low risk study, inferior scar vs subdiaphragmatic attenuation, no anterior defect - I have since discussed case with Dr [name], given he is asymptomatic with negative stress test we have elected not to pursue the staged procedure and treat medically.   - denies any recent chest pain. No DOE or SOB. Walking several miles daily at work without troubles - compliant with meds    2. Hyperlipidemia  panel 11/2013 panel TC 128 HDL 30 LDL 75 TG 114 - compliant with statin   3. Cough - productive cough x 6 months - worst at night time with laying down.   4. Leg cramps - mainly occurs at rest, while sleeping. Does not occur with walking.    Past Medical History  Diagnosis Date  . Hypertriglyceridemia   . Nystagmus   . Blind   . Coronary artery disease   . Myocardial infarction   . Hx of cardiovascular stress test     ETT/Lexiscan Myoview (10/2013):  diaph atten vs inf scar, no ischemia, EF 56%.     Allergies  Allergen Reactions  . Aleve [Naproxen Sodium] Other (See Comments)    Sweating, difficulty breathing  . Iodine Other (See Comments)    Blistering of skin     Current Outpatient Prescriptions  Medication Sig Dispense Refill  . aspirin EC 81 MG EC tablet Take 1 tablet (81 mg total) by mouth daily.    Marland Kitchen atorvastatin (LIPITOR) 80 MG tablet Take 1 tablet (80 mg total) by mouth daily at 6 PM. 90 tablet 3  . lisinopril (PRINIVIL,ZESTRIL) 2.5 MG tablet Take 1 tablet (2.5 mg total) by mouth daily. 90  tablet 3  . metoprolol tartrate (LOPRESSOR) 25 MG tablet Take 0.5 tablets (12.5 mg total) by mouth 2 (two) times daily. 90 tablet 3  . nitroGLYCERIN (NITROSTAT) 0.4 MG SL tablet Place 1 tablet (0.4 mg total) under the tongue every 5 (five) minutes x 3 doses as needed for chest pain. 25 tablet 2   No current facility-administered medications for this visit.     Past Surgical History  Procedure Laterality Date  . Knee surgery    . Eye surgery    . Elbow surgery    . Left heart catheterization with coronary angiogram N/A 10/03/2013    Procedure: LEFT HEART CATHETERIZATION WITH CORONARY ANGIOGRAM;  Surgeon: [name], MD;  Location: [facility] CATH LAB;  Service: Cardiovascular;  Laterality: N/A;     Allergies  Allergen Reactions  . Aleve [Naproxen Sodium] Other (See Comments)    Sweating, difficulty breathing  . Iodine Other (See Comments)    Blistering of skin      Family History  Problem Relation Age of Onset  . Heart disease Father     Pacemaker     Social History Justin Santana reports that he has been smoking Cigarettes.  He has a 37 pack-year smoking history. He does not have any smokeless tobacco history on file. Justin Santana reports that he does not drink alcohol.   Review  of Systems CONSTITUTIONAL: No weight loss, fever, chills, weakness or fatigue.  HEENT: Eyes: No visual loss, blurred vision, double vision or yellow sclerae.No hearing loss, sneezing, congestion, runny nose or sore throat.  SKIN: No rash or itching.  CARDIOVASCULAR: per HPI RESPIRATORY: No shortness of breath, cough or sputum.  GASTROINTESTINAL: No anorexia, nausea, vomiting or diarrhea. No abdominal pain or blood.  GENITOURINARY: No burning on urination, no polyuria NEUROLOGICAL: No headache, dizziness, syncope, paralysis, ataxia, numbness or tingling in the extremities. No change in bowel or bladder control.  MUSCULOSKELETAL: per hpi  LYMPHATICS: No enlarged nodes. No history of splenectomy.    PSYCHIATRIC: No history of depression or anxiety.  ENDOCRINOLOGIC: No reports of sweating, cold or heat intolerance. No polyuria or polydipsia.  Marland Kitchen   Physical Examination Filed Vitals:   05/27/15 1547  BP: 106/72  Pulse: 69   Filed Vitals:   05/27/15 1547  Height: 5\' 10"  (1.778 m)  Weight: 193 lb 12.8 oz (87.907 kg)    Gen: resting comfortably, no acute distress HEENT: no scleral icterus, pupils equal round and reactive, no palptable cervical adenopathy,  CV: RRR, no m/r/g, no jvd Resp: Clear to auscultation bilaterally GI: abdomen is soft, non-tender, non-distended, normal bowel sounds, no hepatosplenomegaly MSK: extremities are warm, no edema.  Skin: warm, no rash Neuro:  no focal deficits Psych: appropriate affect   Diagnostic Studies 09/2012 Cath  PROCEDURAL FINDINGS  Hemodynamics:  AO 115/65  LV 113/18  Coronary angiography:  Coronary dominance: right  Left mainstem: Arises from the left cusp. Widely patent without stenosis. Divides into the LAD and LCx.  Left anterior descending (LAD): The proximal LAD has diffuse 50% stenosis leading into an 80% mid-stenosis. The mid and distal LAD are patent. The first diagonal is small and there is a 90% stenosis in the proximal vessel.  Left circumflex (LCx): The AV circumflex is patent. The first OM is occluded with contrast staining suggestive of acute closure. The second OM is patent.  Right coronary artery (RCA): Large, dominant vessel without significant disease. There is diffuse irregularity before the bifurcation of the PDA and PLA branches.  Left ventriculography:The mid-inferior wall is hypokinetic, but the LVEF is fairly preserved and estimated at 55%, there is no significant mitral regurgitation  PCI Note: Following the diagnostic procedure, the decision was made to proceed with PCI. Weight-based bivalirudin was given for anticoagulation. Once a therapeutic ACT was achieved, a 6 [place] XB 3.5 guide catheter  was inserted. A cougar coronary guidewire was used to cross the lesion in the first OM. The lesion was predilated with a 2.5 mm balloon. The lesion was then stented with a 3.25 x 18 mm Xience Alpine DES stent. The stent was postdilated with a 3.5 mm noncompliant balloon. Following PCI, there was 0% residual stenosis and TIMI-3 flow. Final angiography confirmed an excellent result. The patient tolerated the procedure well. There were no immediate procedural complications. A TR band was used for radial hemostasis. The patient was transferred to the post catheterization recovery area for further monitoring.  PCI Data:  Vessel - OM1  Percent Stenosis (pre) 100  TIMI-flow 0  Stent 3.25 x 18 mm Xience DES  Percent Stenosis (post) 0  TIMI-flow (post) 3  Final Conclusions:  1. Total occlusion of the OM1 treated successfully with primary PCI (DES platform)  2. Severe proximal/mid LAD stenosis  3. Patent, dominant RCA with minor nonobstructive disease  4. Mild segmental LV dysfunction        Assessment and  Plan   1. CAD  - previous STEMI 09/2013, received DES to OM  - plan at that time was staged intervention to 80% LAD lesion, however patient needed to leave hospital for social reasons prior  - recent MPI shows no ischemia, LVEF was normal by LV gram and nuclear. He remains asymptomatic. Discussed case with Dr [name] last visit, continue medical management, will not pursue staged procedure at this time.   - continue medical therapy. Change ACE-I to losartan 12.5mg  qday due to cough  2. Hyperlipidemia  - at goal on current statin, continue current therapy - request recent pcp labs   F/u 6 months     Arnoldo Lenis, M.D.

## 2015-10-03 ENCOUNTER — Other Ambulatory Visit: Payer: Self-pay | Admitting: *Deleted

## 2015-10-04 ENCOUNTER — Telehealth: Payer: Self-pay | Admitting: *Deleted

## 2015-10-04 MED ORDER — LOSARTAN POTASSIUM 25 MG PO TABS: 12.5000 mg | ORAL_TABLET | Freq: Every day | ORAL | Status: DC

## 2015-10-04 NOTE — Telephone Encounter (Signed)
Pt requesting refill for losartan 12.5 mg bid. Per LOV lisinopril was stopped and losartan 12.5 mg qd started. Pt was confused on medication list. Will send refill for losartan 12.5 mg daily. Pt agreed to come by office today for copy of medication list and to clarify what he should be taking.

## 2015-10-05 ENCOUNTER — Other Ambulatory Visit: Payer: Self-pay | Admitting: *Deleted

## 2015-10-05 MED ORDER — LOSARTAN POTASSIUM 25 MG PO TABS: ORAL_TABLET | ORAL | Status: DC

## 2015-10-05 NOTE — Telephone Encounter (Signed)
Per review of chart, patient is supposed to be on losartan 12.5 mg daily. Patient is requesting to take 6.25 mg twice daily. Information faxed to pharmacy via fax.

## 2015-10-06 ENCOUNTER — Encounter: Payer: Self-pay | Admitting: *Deleted

## 2015-11-30 DIAGNOSIS — G5632 Lesion of radial nerve, left upper limb: Secondary | ICD-10-CM | POA: Diagnosis not present

## 2015-11-30 DIAGNOSIS — M2550 Pain in unspecified joint: Secondary | ICD-10-CM | POA: Diagnosis not present

## 2015-12-14 DIAGNOSIS — G5622 Lesion of ulnar nerve, left upper limb: Secondary | ICD-10-CM | POA: Diagnosis not present

## 2015-12-14 DIAGNOSIS — M5030 Other cervical disc degeneration, unspecified cervical region: Secondary | ICD-10-CM | POA: Diagnosis not present

## 2015-12-19 ENCOUNTER — Encounter: Payer: Self-pay | Admitting: *Deleted

## 2015-12-19 ENCOUNTER — Ambulatory Visit (INDEPENDENT_AMBULATORY_CARE_PROVIDER_SITE_OTHER): Payer: BC Managed Care – PPO | Admitting: Cardiology

## 2015-12-19 ENCOUNTER — Encounter: Payer: Self-pay | Admitting: Cardiology

## 2015-12-19 VITALS — BP 106/72 | HR 76 | Ht 70.0 in | Wt 196.0 lb

## 2015-12-19 DIAGNOSIS — I2510 Atherosclerotic heart disease of native coronary artery without angina pectoris: Secondary | ICD-10-CM | POA: Diagnosis not present

## 2015-12-19 DIAGNOSIS — E785 Hyperlipidemia, unspecified: Secondary | ICD-10-CM | POA: Diagnosis not present

## 2015-12-19 MED ORDER — ATORVASTATIN CALCIUM 40 MG PO TABS: 40.0000 mg | ORAL_TABLET | Freq: Every day | ORAL | Status: DC

## 2015-12-19 NOTE — Patient Instructions (Addendum)
Your physician wants you to follow-up in: [place] DR. BRANCH You will receive a reminder letter in the mail two months in advance. If you don't receive a letter, please call our office to schedule the follow-up appointment.  Your physician has recommended you make the following change in your medication:   DECREASE LIPITOR 40 MG DAILY  PLEASE CALL us IN 2-3 WEEKS WITH AN UPDATE ON YOUR SYMPTOMS   Thank you for choosing [facility]!!

## 2015-12-19 NOTE — Progress Notes (Signed)
Patient ID: Justin Santana, male   DOB: 03-07-1966, 50 y.o.   MRN: [id number]     Clinical Summary Justin Santana is a 50 y.o.male seen today for follow up of the following medical problems.  1. CAD  - admit 09/2013 with AMI, found to have totally occluded OM1 along with severe prox/mid LAD disease. OM1 received a DES. Recommendations were for staged procedure to LAD however the patient had pressing social issues regarding custody hearing and requested discharge. - 10/2013 MPI low risk study, inferior scar vs subdiaphragmatic attenuation, no anterior defect - I have since discussed case with Dr [name], given he is asymptomatic with negative stress test we have elected not to pursue the staged procedure and treat medically.   - no recent chest pain symptoms. No recent SOB or DOE. 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    2. Hyperlipidemia  - compliant with statin - reports muscle aches for several months, he questions if related to statin   Past Medical History  Diagnosis Date  . Hypertriglyceridemia   . Nystagmus   . Blind   . Coronary artery disease   . Myocardial infarction ([place])   . Hx of cardiovascular stress test     ETT/Lexiscan Myoview (10/2013):  diaph atten vs inf scar, no ischemia, EF 56%.     Allergies  Allergen Reactions  . Aleve [Naproxen Sodium] Other (See Comments)    Sweating, difficulty breathing  . Iodine Other (See Comments)    Blistering of skin     Current Outpatient Prescriptions  Medication Sig Dispense Refill  . aspirin EC 81 MG EC tablet Take 1 tablet (81 mg total) by mouth daily.    Marland Kitchen atorvastatin (LIPITOR) 80 MG tablet Take 1 tablet (80 mg total) by mouth daily at 6 PM. 90 tablet 3  . losartan (COZAAR) 25 MG tablet Take 12.5 mg by mouth daily.    . metoprolol tartrate (LOPRESSOR) 25 MG tablet Take 0.5 tablets (12.5 mg total) by mouth 2 (two) times daily. 90 tablet 3  . nitroGLYCERIN  (NITROSTAT) 0.4 MG SL tablet Place 1 tablet (0.4 mg total) under the tongue every 5 (five) minutes x 3 doses as needed for chest pain. 25 tablet 2   No current facility-administered medications for this visit.     Past Surgical History  Procedure Laterality Date  . Knee surgery    . Eye surgery    . Elbow surgery    . Left heart catheterization with coronary angiogram N/A 10/03/2013    Procedure: LEFT HEART CATHETERIZATION WITH CORONARY ANGIOGRAM;  Surgeon: [name], MD;  Location: [facility] C[facility] LAB;  Service: Cardiovascular;  Laterality: N/A;     Allergies  Allergen Reactions  . Aleve [Naproxen Sodium] Other (See Comments)    Sweating, difficulty breathing  . Iodine Other (See Comments)    Blistering of skin      Family History  Problem Relation Age of Onset  . Heart disease Father     Pacemaker     Social History Justin Santana reports that he has been smoking Cigarettes.  He has a 37 pack-year smoking history. He does not have any smokeless tobacco history on file. Justin Santana reports that he does not drink alcohol.   Review of Systems CONSTITUTIONAL: No weight loss, fever, chills, weakness or fatigue.  HEENT: Eyes: No visual loss, blurred vision, double vision or yellow sclerae.No hearing loss, sneezing, congestion, runny nose or sore throat.  SKIN: No rash or itching.  CARDIOVASCULAR: per HPI RESPIRATORY: No shortness of breath, cough or sputum.  GASTROINTESTINAL: No anorexia, nausea, vomiting or diarrhea. No abdominal pain or blood.  GENITOURINARY: No burning on urination, no polyuria NEUROLOGICAL: No headache, dizziness, syncope, paralysis, ataxia, numbness or tingling in the extremities. No change in bowel or bladder control.  MUSCULOSKELETAL: No muscle, back pain, joint pain or stiffness.  LYMPHATICS: No enlarged nodes. No history of splenectomy.  PSYCHIATRIC: No history of depression or anxiety.  ENDOCRINOLOGIC: No reports of sweating, cold or heat intolerance.  No polyuria or polydipsia.  Marland Kitchen   Physical Examination Filed Vitals:   12/19/15 1545  BP: 106/72  Pulse: 76   Filed Vitals:   12/19/15 1545  Height: 5\' 10"  (1.778 m)  Weight: 196 lb (88.905 kg)    Gen: resting comfortably, no acute distress HEENT: no scleral icterus, pupils equal round and reactive, no palptable cervical adenopathy,  CV: RRR, no m/r/g, no jvd Resp: Clear to auscultation bilaterally GI: abdomen is soft, non-tender, non-distended, normal bowel sounds, no hepatosplenomegaly MSK: extremities are warm, no edema.  Skin: warm, no rash Neuro:  no focal deficits Psych: appropriate affect   Diagnostic Studies 09/2012 Cath  PROCEDURAL FINDINGS  Hemodynamics:  AO 115/65  LV 113/18  Coronary angiography:  Coronary dominance: right  Left mainstem: Arises from the left cusp. Widely patent without stenosis. Divides into the LAD and LCx.  Left anterior descending (LAD): The proximal LAD has diffuse 50% stenosis leading into an 80% mid-stenosis. The mid and distal LAD are patent. The first diagonal is small and there is a 90% stenosis in the proximal vessel.  Left circumflex (LCx): The AV circumflex is patent. The first OM is occluded with contrast staining suggestive of acute closure. The second OM is patent.  Right coronary artery (RCA): Large, dominant vessel without significant disease. There is diffuse irregularity before the bifurcation of the PDA and PLA branches.  Left ventriculography:The mid-inferior wall is hypokinetic, but the LVEF is fairly preserved and estimated at 55%, there is no significant mitral regurgitation  PCI Note: Following the diagnostic procedure, the decision was made to proceed with PCI. Weight-based bivalirudin was given for anticoagulation. Once a therapeutic ACT was achieved, a 6 [place] XB 3.5 guide catheter was inserted. A cougar coronary guidewire was used to cross the lesion in the first OM. The lesion was predilated with a 2.5 mm  balloon. The lesion was then stented with a 3.25 x 18 mm Xience Alpine DES stent. The stent was postdilated with a 3.5 mm noncompliant balloon. Following PCI, there was 0% residual stenosis and TIMI-3 flow. Final angiography confirmed an excellent result. The patient tolerated the procedure well. There were no immediate procedural complications. A TR band was used for radial hemostasis. The patient was transferred to the post catheterization recovery area for further monitoring.  PCI Data:  Vessel - OM1  Percent Stenosis (pre) 100  TIMI-flow 0  Stent 3.25 x 18 mm Xience DES  Percent Stenosis (post) 0  TIMI-flow (post) 3  Final Conclusions:  1. Total occlusion of the OM1 treated successfully with primary PCI (DES platform)  2. Severe proximal/mid LAD stenosis  3. Patent, dominant RCA with minor nonobstructive disease  4. Mild segmental LV dysfunction         Assessment and Plan  1. CAD  - previous STEMI 09/2013, received DES to OM  - plan at that time was staged intervention to 80% LAD lesion, however patient needed to  leave hospital for social reasons prior  - most recent MPI shows no ischemia, LVEF was normal by LV gram and nuclear. He remains asymptomatic since last visit, remains very active.  - continue medical therapy. No indication for stenting at this time, we discussed this in detail today. Will update Dr [name] on patient's progress per patient request.   2. Hyperlipidemia  - unclear if muscle aches are statin related, will try decreasing lipitor to 40mg  daily and follow symptoms   F/u 6 months      Arnoldo Lenis, M.D.

## 2015-12-27 ENCOUNTER — Encounter: Payer: Self-pay | Admitting: *Deleted

## 2015-12-27 ENCOUNTER — Telehealth: Payer: Self-pay | Admitting: *Deleted

## 2015-12-27 ENCOUNTER — Telehealth: Payer: Self-pay | Admitting: Cardiology

## 2015-12-27 DIAGNOSIS — I2510 Atherosclerotic heart disease of native coronary artery without angina pectoris: Secondary | ICD-10-CM

## 2015-12-27 NOTE — Telephone Encounter (Signed)
Pt aware and agreeable to stress test. Orders placed and forwarded to schedulers.

## 2015-12-27 NOTE — Telephone Encounter (Signed)
-----   Message from Arnoldo Lenis, MD sent at 12/27/2015 12:13 PM EDT ----- Please let patient know I spoke with Dr [name]. He suggests doing a surveillance stress test to see if the blockage may have progressed. Please order an exercise nuclear stress test, he needs to hold his lopressor the day of the test.  [name] MD ----- Message -----    From: Sherren Mocha, MD    Sent: 12/21/2015   9:26 PM      To: Arnoldo Lenis, MD  Looked at films. It's a big LAD with a very tight lesion. If I did it again now, I would still recommend PCI prior to leaving the hospital. With that said, it's hard to recommend intervention in a patient who is totally asymptomatic with a normal stress test. It might be reasonable to do surveillance stress testing since it's been a few years and make decision based on that.   thx Roderic Palau ----- Message -----    From: Arnoldo Lenis, MD    Sent: 12/21/2015   7:18 PM      To: Sherren Mocha, MD  Candice Camp,  This is a guy I touch based with you about a year ago, you stented his OM in 09/2013 with plans for staged PCI to LAD but he had to leave hospital for personal reasons. He had a follow up nuclear stress 10/2013 without ischemia, and has been and remains completely asymptomatic. I have been treating his CAD medically as we discussed last year. He is a bit of a nervous guy and asked me to touch base with you if your thoughts on his LAD have changed, I have continued to recommend medical therapy  J [employer]

## 2015-12-27 NOTE — Telephone Encounter (Signed)
exercise nuclear stress set for 01/02/16 at [facility]

## 2015-12-27 NOTE — Telephone Encounter (Signed)
WILL  TOUCH BASES WITH YOU 0000000 ONCE PRECERT IS OBTAINED

## 2016-01-02 ENCOUNTER — Encounter (HOSPITAL_COMMUNITY): Payer: Self-pay

## 2016-01-02 ENCOUNTER — Encounter (HOSPITAL_COMMUNITY)
Admission: RE | Admit: 2016-01-02 | Discharge: 2016-01-02 | Disposition: A | Discharge status: Home / Self Care | Payer: BC Managed Care – PPO | Source: Ambulatory Visit | Attending: Cardiology | Admitting: Cardiology

## 2016-01-02 ENCOUNTER — Inpatient Hospital Stay (HOSPITAL_COMMUNITY): Admission: RE | Admit: 2016-01-02 | Payer: PPO | Source: Ambulatory Visit

## 2016-01-02 DIAGNOSIS — I252 Old myocardial infarction: Secondary | ICD-10-CM | POA: Insufficient documentation

## 2016-01-02 DIAGNOSIS — I2510 Atherosclerotic heart disease of native coronary artery without angina pectoris: Secondary | ICD-10-CM | POA: Diagnosis not present

## 2016-01-02 LAB — NM MYOCAR MULTI W/SPECT W/WALL MOTION / EF
LV dias vol: 111 mL (ref 62–150)
LV sys vol: 52 mL
Peak HR: 93 {beats}/min
RATE: 0.22
Rest HR: 61 {beats}/min
SDS: 1
SRS: 0
SSS: 1
TID: 1.08

## 2016-01-02 MED ORDER — TECHNETIUM TC 99M TETROFOSMIN IV KIT
10.0000 | PACK | Freq: Once | INTRAVENOUS | Status: AC | PRN
  Administered 2016-01-02: 10 via INTRAVENOUS

## 2016-01-02 MED ORDER — TECHNETIUM TC 99M TETROFOSMIN IV KIT
30.0000 | PACK | Freq: Once | INTRAVENOUS | Status: AC | PRN
  Administered 2016-01-02: 30 via INTRAVENOUS

## 2016-01-02 MED ORDER — REGADENOSON 0.4 MG/5ML IV SOLN
INTRAVENOUS | Status: AC
  Administered 2016-01-02: 0.4 mg via INTRAVENOUS
  Filled 2016-01-02: qty 5

## 2016-01-02 MED ORDER — SODIUM CHLORIDE 0.9% FLUSH
INTRAVENOUS | Status: AC
  Administered 2016-01-02: 10 mL via INTRAVENOUS
  Filled 2016-01-02: qty 10

## 2016-01-02 MED ORDER — SODIUM CHLORIDE 0.9% FLUSH
INTRAVENOUS | Status: AC
  Filled 2016-01-02: qty 150

## 2016-01-04 ENCOUNTER — Telehealth: Payer: Self-pay | Admitting: *Deleted

## 2016-01-04 NOTE — Telephone Encounter (Signed)
Pt aware, routed to pcp 

## 2016-01-04 NOTE — Telephone Encounter (Signed)
-----   Message from Drema Dallas, [place] sent at 01/04/2016  9:17 AM EDT ----- Ledell Noss pt.  ----- Message -----    From: Arnoldo Lenis, MD    Sent: 01/03/2016   1:29 PM      To: Drema Dallas, CMA  Stress test looks good. All evidence continues to show that his heart is getting plenty of blood. This goes along with his lack of symptoms. We will continue to monitor at this time, no indicatoin for repeat heart cath   J BrancH MD

## 2016-01-23 ENCOUNTER — Telehealth: Payer: Self-pay | Admitting: *Deleted

## 2016-01-23 NOTE — Telephone Encounter (Signed)
Patient informed and verbalized understanding of plan. 

## 2016-01-23 NOTE — Telephone Encounter (Signed)
Try stopping atorvastatin for 3 weeks and have him update [place] on symptoms at this time.    [name] MD

## 2016-01-23 NOTE — Telephone Encounter (Signed)
Patient c/o continued muscle pains and decreased muscle strength and thinks its due to the atorvastatin. Patient's dose was decreased to 40 mg 3 weeks ago because of the side effects he was having. Patient advised to hold atorvastatin until he hears back from our office. Patient advised that he would be contacted after a response is given from Dr. [name] which may be a few days from now. Patient verbalized understanding of plan.

## 2016-03-31 ENCOUNTER — Other Ambulatory Visit: Payer: Self-pay | Admitting: Cardiology

## 2016-05-17 DIAGNOSIS — K219 Gastro-esophageal reflux disease without esophagitis: Secondary | ICD-10-CM | POA: Diagnosis not present

## 2016-05-17 DIAGNOSIS — I2510 Atherosclerotic heart disease of native coronary artery without angina pectoris: Secondary | ICD-10-CM | POA: Diagnosis not present

## 2016-05-17 DIAGNOSIS — J309 Allergic rhinitis, unspecified: Secondary | ICD-10-CM | POA: Diagnosis not present

## 2016-05-17 DIAGNOSIS — M5431 Sciatica, right side: Secondary | ICD-10-CM | POA: Diagnosis not present

## 2016-05-17 DIAGNOSIS — Z720 Tobacco use: Secondary | ICD-10-CM | POA: Diagnosis not present

## 2016-06-14 DIAGNOSIS — J0100 Acute maxillary sinusitis, unspecified: Secondary | ICD-10-CM | POA: Diagnosis not present

## 2016-06-14 DIAGNOSIS — I10 Essential (primary) hypertension: Secondary | ICD-10-CM | POA: Diagnosis not present

## 2016-06-14 DIAGNOSIS — Z720 Tobacco use: Secondary | ICD-10-CM | POA: Diagnosis not present

## 2016-06-14 DIAGNOSIS — R05 Cough: Secondary | ICD-10-CM | POA: Diagnosis not present

## 2016-07-16 ENCOUNTER — Encounter: Payer: Self-pay | Admitting: *Deleted

## 2016-07-17 ENCOUNTER — Ambulatory Visit (INDEPENDENT_AMBULATORY_CARE_PROVIDER_SITE_OTHER): Payer: BC Managed Care – PPO | Admitting: Cardiology

## 2016-07-17 ENCOUNTER — Encounter: Payer: Self-pay | Admitting: Cardiology

## 2016-07-17 VITALS — BP 116/74 | HR 70 | Ht 70.0 in | Wt 197.6 lb

## 2016-07-17 DIAGNOSIS — R0789 Other chest pain: Secondary | ICD-10-CM

## 2016-07-17 DIAGNOSIS — E782 Mixed hyperlipidemia: Secondary | ICD-10-CM | POA: Diagnosis not present

## 2016-07-17 DIAGNOSIS — I2510 Atherosclerotic heart disease of native coronary artery without angina pectoris: Secondary | ICD-10-CM

## 2016-07-17 NOTE — Patient Instructions (Signed)
Your physician wants you to follow-up in: [place] DR. BRANCH You will receive a reminder letter in the mail two months in advance. If you don't receive a letter, please call our office to schedule the follow-up appointment.  Your physician has recommended you make the following change in your medication:   TAKE OTC ZANTAC 150 MG TWICE DAILY  HOLD LOPRESSOR (METORPOLOL) FOR 3 WEEKS AND CALL us WITH AN UPDATE ON YOUR SYMPTOMS   Thank you for choosing [facility]!!

## 2016-07-17 NOTE — Progress Notes (Signed)
Clinical Summary Justin Santana is a 50 y.o.male seen today for follow up of the following medical problems.  1. CAD  - admit 09/2013 with AMI, found to have totally occluded OM1 along with severe prox/mid LAD disease. OM1 received a DES. Recommendations were for staged procedure to LAD however the patient had pressing social issues regarding custody hearing and requested discharge. - 10/2013 MPI low risk study, inferior scar vs subdiaphragmatic attenuation, no anterior defect - I have since discussed case with Dr [name], given he is asymptomatic with negative stress test we have elected not to pursue the staged procedure and treat medically.  12/2015 nuclear stress test: no ischemia.     - reports recent fatigue and generalized body aches, he thinks may be related to lopressor. Symptoms started after heart attack.  - no recent chest pain or SOB/DOE   2. Hyperlipidemia  - compliant with statin - reports muscle aches for several months, he questions if related to statin - we had lowered atorva to 40mg  daily, symptoms continued. He was to stop statin for 3 weeks back in 01/2016. - off lipitor, body aches are improved not resolved.   3. Chest pain - has knot in chest at times, on right side. Comes on with stress. Worst 3/10 in severity. No other associated symptoms. Can last few hours up to a day, constant pain. Not positional. No relation to food. Symptoms occur twice a week.   Past Medical History:  Diagnosis Date  . Blind   . Cancer ([place])   . Coronary artery disease   . Hx of cardiovascular stress test    ETT/Lexiscan Myoview (10/2013):  diaph atten vs inf scar, no ischemia, EF 56%.  Marland Kitchen Hypertriglyceridemia   . Myocardial infarction   . Nystagmus      Allergies  Allergen Reactions  . Aleve [Naproxen Sodium] Other (See Comments)    Sweating, difficulty breathing  . Iodine Other (See Comments)    Blistering of skin     Current Outpatient Prescriptions  Medication  Sig Dispense Refill  . aspirin EC 81 MG EC tablet Take 1 tablet (81 mg total) by mouth daily.    Marland Kitchen atorvastatin (LIPITOR) 40 MG tablet Take 1 tablet (40 mg total) by mouth daily. 90 tablet 3  . losartan (COZAAR) 25 MG tablet Take 12.5 mg by mouth daily.    . metoprolol tartrate (LOPRESSOR) 25 MG tablet TAKE 1/2 TABLET BY MOUTH TWICE DAILY 90 tablet 3  . nitroGLYCERIN (NITROSTAT) 0.4 MG SL tablet Place 1 tablet (0.4 mg total) under the tongue every 5 (five) minutes x 3 doses as needed for chest pain. 25 tablet 2   No current facility-administered medications for this visit.      Past Surgical History:  Procedure Laterality Date  . ELBOW SURGERY    . EYE SURGERY    . KNEE SURGERY    . LEFT HEART CATHETERIZATION WITH CORONARY ANGIOGRAM N/A 10/03/2013   Procedure: LEFT HEART CATHETERIZATION WITH CORONARY ANGIOGRAM;  Surgeon: [name], MD;  Location: [facility] CATH LAB;  Service: Cardiovascular;  Laterality: N/A;     Allergies  Allergen Reactions  . Aleve [Naproxen Sodium] Other (See Comments)    Sweating, difficulty breathing  . Iodine Other (See Comments)    Blistering of skin      Family History  Problem Relation Age of Onset  . Heart disease Father     Pacemaker     Social History Justin Santana reports that he has  been smoking Cigarettes.  He started smoking about 39 years ago. He has a 37.00 pack-year smoking history. He has never used smokeless tobacco. Justin Santana reports that he does not drink alcohol.   Review of Systems CONSTITUTIONAL: No weight loss, fever, chills, weakness or fatigue.  HEENT: Eyes: No visual loss, blurred vision, double vision or yellow sclerae.No hearing loss, sneezing, congestion, runny nose or sore throat.  SKIN: No rash or itching.  CARDIOVASCULAR: per HPI RESPIRATORY: No shortness of breath, cough or sputum.  GASTROINTESTINAL: No anorexia, nausea, vomiting or diarrhea. No abdominal pain or blood.  GENITOURINARY: No burning on urination, no  polyuria NEUROLOGICAL: No headache, dizziness, syncope, paralysis, ataxia, numbness or tingling in the extremities. No change in bowel or bladder control.  MUSCULOSKELETAL: No muscle, back pain, joint pain or stiffness.  LYMPHATICS: No enlarged nodes. No history of splenectomy.  PSYCHIATRIC: No history of depression or anxiety.  ENDOCRINOLOGIC: No reports of sweating, cold or heat intolerance. No polyuria or polydipsia.  Marland Kitchen   Physical Examination Vitals:   07/17/16 1549  BP: 116/74  Pulse: 70   Vitals:   07/17/16 1549  Weight: 197 lb 9.6 oz (89.6 kg)  Height: 5\' 10"  (1.778 m)    Gen: resting comfortably, no acute distress HEENT: no scleral icterus, pupils equal round and reactive, no palptable cervical adenopathy,  CV: RRR, no m/r/g, no jvd Resp: Clear to auscultation bilaterally GI: abdomen is soft, non-tender, non-distended, normal bowel sounds, no hepatosplenomegaly MSK: extremities are warm, no edema.  Skin: warm, no rash Neuro:  no focal deficits Psych: appropriate affect   Diagnostic Studies 09/2012 Cath  PROCEDURAL FINDINGS  Hemodynamics:  AO 115/65  LV 113/18  Coronary angiography:  Coronary dominance: right  Left mainstem: Arises from the left cusp. Widely patent without stenosis. Divides into the LAD and LCx.  Left anterior descending (LAD): The proximal LAD has diffuse 50% stenosis leading into an 80% mid-stenosis. The mid and distal LAD are patent. The first diagonal is small and there is a 90% stenosis in the proximal vessel.  Left circumflex (LCx): The AV circumflex is patent. The first OM is occluded with contrast staining suggestive of acute closure. The second OM is patent.  Right coronary artery (RCA): Large, dominant vessel without significant disease. There is diffuse irregularity before the bifurcation of the PDA and PLA branches.  Left ventriculography:The mid-inferior wall is hypokinetic, but the LVEF is fairly preserved and estimated at  55%, there is no significant mitral regurgitation  PCI Note: Following the diagnostic procedure, the decision was made to proceed with PCI. Weight-based bivalirudin was given for anticoagulation. Once a therapeutic ACT was achieved, a 6 [place] XB 3.5 guide catheter was inserted. A cougar coronary guidewire was used to cross the lesion in the first OM. The lesion was predilated with a 2.5 mm balloon. The lesion was then stented with a 3.25 x 18 mm Xience Alpine DES stent. The stent was postdilated with a 3.5 mm noncompliant balloon. Following PCI, there was 0% residual stenosis and TIMI-3 flow. Final angiography confirmed an excellent result. The patient tolerated the procedure well. There were no immediate procedural complications. A TR band was used for radial hemostasis. The patient was transferred to the post catheterization recovery area for further monitoring.  PCI Data:  Vessel - OM1  Percent Stenosis (pre) 100  TIMI-flow 0  Stent 3.25 x 18 mm Xience DES  Percent Stenosis (post) 0  TIMI-flow (post) 3  Final Conclusions:  1. Total occlusion of  the OM1 treated successfully with primary PCI (DES platform)  2. Severe proximal/mid LAD stenosis  3. Patent, dominant RCA with minor nonobstructive disease  4. Mild segmental LV dysfunction   12/2015 MPI  There was no ST segment deviation noted during stress.  The study is normal.  This is a low risk study. There are no perfusion defects consistent with prior infarct or current ischemia.  The left ventricular ejection fraction is mildly decreased (45-54%).     Assessment and Plan  1. CAD/Chest pain - previous STEMI 09/2013, received DES to OM  - plan at that time was staged intervention to 80% LAD lesion, however patient needed to leave hospital for social reasons prior  - 12/2015 MPI shows no ischemia, LVEF was normal by LV gram and nuclear. He remains asymptomatic since last visit, remains very active.   - in absence  of ischemia by imaging and absence of any symptoms, continue medical therapy. Recent symptoms atypical for cardiac chest pain, recommend trial of OTC zantac.  - generalized fatigue, we will try holding lopressor and follow symptoms. He is to update [place] in 3 weeks.   2. Hyperlipidemia  - muscle aches on statins, we will remain off at this time.  - discussed trial of low dose pravastatin, he is not in favor at this time     F/u 6 months      Justin Santana, M.D., F.A.C.C.

## 2016-10-26 DIAGNOSIS — J069 Acute upper respiratory infection, unspecified: Secondary | ICD-10-CM | POA: Diagnosis not present

## 2016-10-26 DIAGNOSIS — R490 Dysphonia: Secondary | ICD-10-CM | POA: Diagnosis not present

## 2016-10-26 DIAGNOSIS — Z720 Tobacco use: Secondary | ICD-10-CM | POA: Diagnosis not present

## 2016-10-26 DIAGNOSIS — I10 Essential (primary) hypertension: Secondary | ICD-10-CM | POA: Diagnosis not present

## 2016-11-19 ENCOUNTER — Other Ambulatory Visit: Payer: Self-pay | Admitting: Cardiology

## 2016-11-20 ENCOUNTER — Other Ambulatory Visit: Payer: Self-pay | Admitting: *Deleted

## 2016-11-20 MED ORDER — LOSARTAN POTASSIUM 25 MG PO TABS: 12.5000 mg | ORAL_TABLET | Freq: Every day | ORAL | 1 refills | Status: DC

## 2017-01-22 ENCOUNTER — Encounter: Payer: Self-pay | Admitting: Cardiology

## 2017-01-22 ENCOUNTER — Ambulatory Visit (INDEPENDENT_AMBULATORY_CARE_PROVIDER_SITE_OTHER): Payer: BC Managed Care – PPO | Admitting: Cardiology

## 2017-01-22 VITALS — BP 129/77 | HR 77 | Ht 70.0 in | Wt 199.0 lb

## 2017-01-22 DIAGNOSIS — E782 Mixed hyperlipidemia: Secondary | ICD-10-CM | POA: Diagnosis not present

## 2017-01-22 DIAGNOSIS — I2510 Atherosclerotic heart disease of native coronary artery without angina pectoris: Secondary | ICD-10-CM

## 2017-01-22 MED ORDER — PRAVASTATIN SODIUM 20 MG PO TABS: 20.0000 mg | ORAL_TABLET | Freq: Every evening | ORAL | 1 refills | Status: DC

## 2017-01-22 NOTE — Patient Instructions (Signed)
Your physician wants you to follow-up in: [place] will receive a reminder letter in the mail two months in advance. If you don't receive a letter, please call our office to schedule the follow-up appointment.  Your physician has recommended you make the following change in your medication:   START PRAVASTATIN 20 MG DAILY  Thank you for choosing [facility]!!

## 2017-01-22 NOTE — Progress Notes (Signed)
Clinical Summary Justin Santana is a 51 y.o.male seen today for follow up of the following medical problems.  1. CAD  - admit 09/2013 with AMI, found to have totally occluded OM1 along with severe prox/mid LAD disease. OM1 received a DES. Recommendations were for staged procedure to LAD however the patient had pressing social issues regarding custody hearing and requested discharge. - 10/2013 MPI low risk study, inferior scar vs subdiaphragmatic attenuation, no anterior defect - I have since discussed case with Dr [name], given he is asymptomatic with negative stress test we have elected not to pursue the staged procedure and treat medically.  12/2015 nuclear stress test: no ischemia.   - no recent chest pain or SOB/DOE.  - fatigue on low dose lopressor that has resolved.      2. Hyperlipidemia  - did not tolerate lipitor due to muscle aches - has been off statin recently.       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 Blind   . Cancer ([place])   . Coronary artery disease   . Hx of cardiovascular stress test    ETT/Lexiscan Myoview (10/2013):  diaph atten vs inf scar, no ischemia, EF 56%.  Marland Kitchen Hypertriglyceridemia   . Myocardial infarction   . Nystagmus      Allergies  Allergen Reactions  . Aleve [Naproxen Sodium] Other (See Comments)    Sweating, difficulty breathing  . Iodine Other (See Comments)    Blistering of skin     Current Outpatient Prescriptions  Medication Sig Dispense Refill  . aspirin EC 81 MG EC tablet Take 1 tablet (81 mg total) by mouth daily.    Marland Kitchen losartan (COZAAR) 25 MG tablet Take 0.5 tablets (12.5 mg total) by mouth daily. 45 tablet 1  . metoprolol tartrate (LOPRESSOR) 25 MG tablet TAKE 1/2 TABLET BY MOUTH TWICE DAILY 90 tablet 3  . Nicotine (NICODERM CQ TD) Place onto the skin.    Marland Kitchen nitroGLYCERIN (NITROSTAT) 0.4 MG SL tablet Place 1 tablet (0.4 mg total) under the tongue every 5 (five) minutes x 3  doses as needed for chest pain. 25 tablet 2  . ranitidine (ZANTAC) 150 MG tablet Take 150 mg by mouth 2 (two) times daily.    Marland Kitchen tiZANidine (ZANAFLEX) 4 MG tablet Take 4 mg by mouth 2 (two) times daily.    . traMADol (ULTRAM) 50 MG tablet Take 50 mg by mouth 2 (two) times daily.     No current facility-administered medications for this visit.      Past Surgical History:  Procedure Laterality Date  . ELBOW SURGERY    . EYE SURGERY    . KNEE SURGERY    . LEFT HEART CATHETERIZATION WITH CORONARY ANGIOGRAM N/A 10/03/2013   Procedure: LEFT HEART CATHETERIZATION WITH CORONARY ANGIOGRAM;  Surgeon: [name], MD;  Location: [facility] CATH LAB;  Service: Cardiovascular;  Laterality: N/A;     Allergies  Allergen Reactions  . Aleve [Naproxen Sodium] Other (See Comments)    Sweating, difficulty breathing  . Iodine Other (See Comments)    Blistering of skin      Family History  Problem Relation Age of Onset  . Heart disease Father        Pacemaker     Social History Mr. Hankin reports that he has been smoking Cigarettes.  He started smoking about 40 years ago. He has a 37.00 pack-year smoking history. He has never used smokeless tobacco. Mr.  Zapata reports that he does not drink alcohol.   Review of Systems CONSTITUTIONAL: No weight loss, fever, chills, weakness or fatigue.  HEENT: Eyes: No visual loss, blurred vision, double vision or yellow sclerae.No hearing loss, sneezing, congestion, runny nose or sore throat.  SKIN: No rash or itching.  CARDIOVASCULAR: per hpi RESPIRATORY: No shortness of breath, cough or sputum.  GASTROINTESTINAL: No anorexia, nausea, vomiting or diarrhea. No abdominal pain or blood.  GENITOURINARY: No burning on urination, no polyuria NEUROLOGICAL: No headache, dizziness, syncope, paralysis, ataxia, numbness or tingling in the extremities. No change in bowel or bladder control.  MUSCULOSKELETAL: No muscle, back pain, joint pain or stiffness.  LYMPHATICS:  No enlarged nodes. No history of splenectomy.  PSYCHIATRIC: No history of depression or anxiety.  ENDOCRINOLOGIC: No reports of sweating, cold or heat intolerance. No polyuria or polydipsia.  Marland Kitchen   Physical Examination Vitals:   01/22/17 1558  BP: 129/77  Pulse: 77   Vitals:   01/22/17 1558  Weight: 199 lb (90.3 kg)  Height: 5\' 10"  (1.778 m)    Gen: resting comfortably, no acute distress HEENT: no scleral icterus, pupils equal round and reactive, no palptable cervical adenopathy,  CV: RRR, no m/r/g, no jvd Resp: Clear to auscultation bilaterally GI: abdomen is soft, non-tender, non-distended, normal bowel sounds, no hepatosplenomegaly MSK: extremities are warm, no edema.  Skin: warm, no rash Neuro:  no focal deficits Psych: appropriate affect   Diagnostic Studies 09/2012 Cath PROCEDURAL FINDINGS  Hemodynamics:  AO 115/65  LV 113/18  Coronary angiography:  Coronary dominance: right  Left mainstem: Arises from the left cusp. Widely patent without stenosis. Divides into the LAD and LCx.  Left anterior descending (LAD): The proximal LAD has diffuse 50% stenosis leading into an 80% mid-stenosis. The mid and distal LAD are patent. The first diagonal is small and there is a 90% stenosis in the proximal vessel.  Left circumflex (LCx): The AV circumflex is patent. The first OM is occluded with contrast staining suggestive of acute closure. The second OM is patent.  Right coronary artery (RCA): Large, dominant vessel without significant disease. There is diffuse irregularity before the bifurcation of the PDA and PLA branches.  Left ventriculography:The mid-inferior wall is hypokinetic, but the LVEF is fairly preserved and estimated at 55%, there is no significant mitral regurgitation  PCI Note: Following the diagnostic procedure, the decision was made to proceed with PCI. Weight-based bivalirudin was given for anticoagulation. Once a therapeutic ACT was achieved, a 6 [place]  XB 3.5 guide catheter was inserted. A cougar coronary guidewire was used to cross the lesion in the first OM. The lesion was predilated with a 2.5 mm balloon. The lesion was then stented with a 3.25 x 18 mm Xience Alpine DES stent. The stent was postdilated with a 3.5 mm noncompliant balloon. Following PCI, there was 0% residual stenosis and TIMI-3 flow. Final angiography confirmed an excellent result. The patient tolerated the procedure well. There were no immediate procedural complications. A TR band was used for radial hemostasis. The patient was transferred to the post catheterization recovery area for further monitoring.  PCI Data:  Vessel - OM1  Percent Stenosis (pre) 100  TIMI-flow 0  Stent 3.25 x 18 mm Xience DES  Percent Stenosis (post) 0  TIMI-flow (post) 3  Final Conclusions:  1. Total occlusion of the OM1 treated successfully with primary PCI (DES platform)  2. Severe proximal/mid LAD stenosis  3. Patent, dominant RCA with minor nonobstructive disease  4. Mild segmental  LV dysfunction   12/2015 MPI  There was no ST segment deviation noted during stress.  The study is normal.  This is a low risk study. There are no perfusion defects consistent with prior infarct or current ischemia.  The left ventricular ejection fraction is mildly decreased (45-54%).     Assessment and Plan  1. CAD - no recent symptoms. EKG SR, no ischemic changes.  - continue current meds. No beta blocker due to fatigue. Muscle aches on lipitor previously, will try low dose pravastatin  2. Hyperlipidemia  - muscle aches on lipitor, try low dose pravastatin   F/u 1 year      Arnoldo Lenis, M.D.

## 2017-02-28 ENCOUNTER — Encounter: Payer: Self-pay | Admitting: Internal Medicine

## 2017-03-07 ENCOUNTER — Other Ambulatory Visit (HOSPITAL_COMMUNITY): Payer: Self-pay | Admitting: Internal Medicine

## 2017-03-07 DIAGNOSIS — M5030 Other cervical disc degeneration, unspecified cervical region: Secondary | ICD-10-CM

## 2017-03-25 ENCOUNTER — Ambulatory Visit: Payer: BC Managed Care – PPO | Admitting: Gastroenterology

## 2017-04-26 ENCOUNTER — Ambulatory Visit (HOSPITAL_COMMUNITY): Payer: BC Managed Care – PPO

## 2017-05-03 ENCOUNTER — Encounter (HOSPITAL_COMMUNITY): Payer: Self-pay

## 2017-05-03 ENCOUNTER — Ambulatory Visit: Payer: BC Managed Care – PPO | Admitting: Gastroenterology

## 2017-05-03 ENCOUNTER — Ambulatory Visit (HOSPITAL_COMMUNITY): Payer: BC Managed Care – PPO

## 2017-05-04 ENCOUNTER — Other Ambulatory Visit: Payer: Self-pay | Admitting: Cardiology

## 2017-05-10 ENCOUNTER — Encounter: Payer: Self-pay | Admitting: Nurse Practitioner

## 2017-05-10 ENCOUNTER — Ambulatory Visit (INDEPENDENT_AMBULATORY_CARE_PROVIDER_SITE_OTHER): Payer: BC Managed Care – PPO | Admitting: Nurse Practitioner

## 2017-05-10 DIAGNOSIS — K625 Hemorrhage of anus and rectum: Secondary | ICD-10-CM

## 2017-05-10 DIAGNOSIS — K649 Unspecified hemorrhoids: Secondary | ICD-10-CM | POA: Insufficient documentation

## 2017-05-10 NOTE — Progress Notes (Signed)
Primary Care Physician:  Celene Squibb, MD Primary Gastroenterologist:  Dr. Gala Romney  Chief Complaint  Patient presents with  . Hemorrhoids    HPI:   Justin Santana is a 51 y.o. male who presents on referral from primary care for hemorrhoids and GI bleed. Patient last saw primary care on 02/22/2017. PCP notes reviewed. The patient complained of hemorrhoids and lead in his stools for some time. Essentially hematochezia for 6 months, typically scant toilet tissue hematochezia and symptomatic hemorrhoids for which she was using Preparation H. CBC was ordered and collected 02/22/2017 which found a normal hemoglobin of 14.9. CMP essentially normal other than hyperkalemia with a potassium of 5.4. The patient is 51 years old and does not appear to without a colonoscopy before. No colonoscopy was found in our system.  Today he states he's doing ok overall. Has a history of hemorrhoids. Has had scant toilet tissue hematochezia for the past 6 months. Has been using Preparation H which he feels doesn't help. Vaseline helps. No constipation. 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 Denies abdominal pain, N/V, melena, fever, chills, unintentional weight loss, acute changes in bowel habits/charicteristics/frequency. Occasional mild shortness of breath with exertion. Denies chest pain, dizziness, lightheadedness, syncope, near syncope. Denies any other upper or lower GI symptoms.  Past Medical History:  Diagnosis Date  . Blind    "born legally blind"  . Cancer ([place])    Skin cancer - melenoma per patient  . Coronary artery disease   . Hx of cardiovascular stress test    ETT/Lexiscan Myoview (10/2013):  diaph atten vs inf scar, no ischemia, EF 56%.  Marland Kitchen Hypertriglyceridemia   . Myocardial infarction ([place])   . Nystagmus     Past Surgical History:  Procedure Laterality Date  . ELBOW  SURGERY    . EYE SURGERY    . KNEE SURGERY    . LEFT HEART CATHETERIZATION WITH CORONARY ANGIOGRAM N/A 10/03/2013   Procedure: LEFT HEART CATHETERIZATION WITH CORONARY ANGIOGRAM;  Surgeon: [name], MD;  Location[facility] CATH LAB;  Service: Cardiovascular;  Laterality: N/A;    Current Outpatient Prescriptions  Medication Sig Dispense Refill  . aspirin EC 81 MG EC tablet Take 1 tablet (81 mg total) by mouth daily.    Marland Kitchen losartan (COZAAR) 25 MG tablet TAKE 1/2 TABLET (12.5 MG TOTAL) BY MOUTH DAILY. (PLEASE CUT IN HALF FOR PT) 45 tablet 3  . pravastatin (PRAVACHOL) 20 MG tablet Take 1 tablet (20 mg total) by mouth every evening. 90 tablet 1   No current facility-administered medications for this visit.     Allergies as of 05/10/2017 - Review Complete 05/10/2017  Allergen Reaction Noted  . Aleve [naproxen sodium] Other (See Comments) 10/03/2013  . Iodine Other (See Comments) 10/03/2013    Family History  Problem Relation Age of Onset  . Heart disease Father        Pacemaker  . Colon cancer Neg Hx     Social History   Social History  . Marital status: Divorced    Spouse name: N/A  . Number of children: 1  . Years of education: N/A   Occupational History  . Not on file.   Social History Main Topics  . Smoking status: Current Every Day Smoker    Packs/day: 0.25    Years: 37.00    Types: Cigarettes    Start date:  09/29/1976  . Smokeless tobacco: Never Used     Comment: 15 ciggs per day  . Alcohol use No  . Drug use: No  . Sexual activity: Not on file   Other Topics Concern  . Not on file   Social History Narrative   Lives with daughter.      Review of Systems: General: Negative for anorexia, weight loss, fever, chills, fatigue, weakness. ENT: Negative for hoarseness, difficulty swallowing. CV: Negative for chest pain, angina, palpitations, peripheral edema.  Respiratory: Negative for dyspnea at rest, cough, sputum, wheezing.  GI: See history of present  illness. MS: Negative for joint pain, low back pain.  Derm: Negative for rash or itching.  Endo: Negative for unusual weight change.  Heme: Negative for bruising or bleeding. Allergy: Negative for rash or hives.    Physical Exam: BP 130/85   Pulse 73   Temp 97.8 F (36.6 C) (Oral)   Ht 5\' 10"  (1.778 m)   Wt 200 lb 9.6 oz (91 kg)   BMI 28.78 kg/m  General:   Alert and oriented. Pleasant and cooperative. Well-nourished and well-developed.  Head:  Normocephalic and atraumatic. Eyes:  Without icterus, sclera clear and conjunctiva pink.  Ears:  Normal auditory acuity. Cardiovascular:  S1, S2 present without murmurs appreciated. Extremities without clubbing or edema. Respiratory:  Clear to auscultation bilaterally. No wheezes, rales, or rhonchi. No distress.  Gastrointestinal:  +BS, rounded but soft, non-tender and non-distended. No HSM noted. No guarding or rebound. No masses appreciated.  Rectal:  Deferred  Musculoskalatal:  Symmetrical without gross deformities. Normal posture. Neurologic:  Alert and oriented x4;  grossly normal neurologically. Psych:  Alert and cooperative. Normal mood and affect. Heme/Lymph/Immune: No excessive bruising noted.    05/10/2017 11:08 AM   Disclaimer: This note was dictated with voice recognition software. Similar sounding words can inadvertently be transcribed and may not be corrected upon review.

## 2017-05-10 NOTE — Patient Instructions (Signed)
1. We will schedule your colonoscopy for you. 2. Call if you would like [place] to send in Anusol cream to your pharmacy. 3. Return for follow-up in 3 months. 4. Call us if you have any questions or concerns.

## 2017-05-10 NOTE — Assessment & Plan Note (Signed)
Rectal bleeding in the form of scant toilet tissue hematochezia over the past 6 months. Known history of hemorrhoids. He is 89 and has never had a colonoscopy and would be due regardless. Given his GI bleed this is more pressing as well. Differentials include benign anorectal source, hemorrhoids. Less likely bleeding polyp, inflammatory bowel disease, colorectal cancer. We will proceed with colonoscopy for routine screening as well as to evaluate for rectal bleeding.  Proceed with TCS with Dr. Gala Romney in near future: the risks, benefits, and alternatives have been discussed with the patient in detail. The patient states understanding and desires to proceed.  The patient is not on any anticoagulants, anxiolytics, chronic pain medications, or antidepressants. He does have a history of left heart cath and stent placement in 2015 and he is well beyond the timeframe for allowing elective procedures.

## 2017-05-10 NOTE — Assessment & Plan Note (Signed)
The patient is a long-standing history of hemorrhoids including a history of hemorrhoid rupture in 1992. He began having more rectal bleeding which she feels is related to his hemorrhoids and the past 6 months. 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 Preparation H does not help. He states they are not bothering him at this time. I informed him he can call if he would like Anusol sent to the pharmacy to see if that helps any better. Return for follow-up in 3 months.

## 2017-05-13 ENCOUNTER — Other Ambulatory Visit: Payer: Self-pay

## 2017-05-13 ENCOUNTER — Encounter: Payer: Self-pay | Admitting: Nurse Practitioner

## 2017-05-13 DIAGNOSIS — K625 Hemorrhage of anus and rectum: Secondary | ICD-10-CM

## 2017-05-13 MED ORDER — PEG 3350-KCL-NA BICARB-NACL 420 G PO SOLR: 4000.0000 mL | ORAL | 0 refills | Status: DC

## 2017-05-13 NOTE — Progress Notes (Signed)
CC'D TO PCP °

## 2017-06-14 ENCOUNTER — Ambulatory Visit (HOSPITAL_COMMUNITY)
Admission: RE | Admit: 2017-06-14 | Discharge: 2017-06-14 | Disposition: A | Discharge status: Home / Self Care | Payer: BC Managed Care – PPO | Source: Ambulatory Visit | Attending: Internal Medicine | Admitting: Internal Medicine

## 2017-06-14 ENCOUNTER — Encounter (HOSPITAL_COMMUNITY): Payer: Self-pay | Admitting: *Deleted

## 2017-06-14 ENCOUNTER — Encounter (HOSPITAL_COMMUNITY)
Admission: RE | Disposition: A | Discharge status: Home / Self Care | Payer: Self-pay | Source: Ambulatory Visit | Attending: Internal Medicine

## 2017-06-14 DIAGNOSIS — F17210 Nicotine dependence, cigarettes, uncomplicated: Secondary | ICD-10-CM | POA: Diagnosis not present

## 2017-06-14 DIAGNOSIS — Z79899 Other long term (current) drug therapy: Secondary | ICD-10-CM | POA: Insufficient documentation

## 2017-06-14 DIAGNOSIS — Z1212 Encounter for screening for malignant neoplasm of rectum: Secondary | ICD-10-CM

## 2017-06-14 DIAGNOSIS — K621 Rectal polyp: Secondary | ICD-10-CM | POA: Insufficient documentation

## 2017-06-14 DIAGNOSIS — I252 Old myocardial infarction: Secondary | ICD-10-CM | POA: Diagnosis not present

## 2017-06-14 DIAGNOSIS — D125 Benign neoplasm of sigmoid colon: Secondary | ICD-10-CM | POA: Diagnosis not present

## 2017-06-14 DIAGNOSIS — H547 Unspecified visual loss: Secondary | ICD-10-CM | POA: Diagnosis not present

## 2017-06-14 DIAGNOSIS — I2510 Atherosclerotic heart disease of native coronary artery without angina pectoris: Secondary | ICD-10-CM | POA: Insufficient documentation

## 2017-06-14 DIAGNOSIS — D123 Benign neoplasm of transverse colon: Secondary | ICD-10-CM | POA: Diagnosis not present

## 2017-06-14 DIAGNOSIS — Z7982 Long term (current) use of aspirin: Secondary | ICD-10-CM | POA: Diagnosis not present

## 2017-06-14 DIAGNOSIS — Z85820 Personal history of malignant melanoma of skin: Secondary | ICD-10-CM | POA: Diagnosis not present

## 2017-06-14 DIAGNOSIS — K625 Hemorrhage of anus and rectum: Secondary | ICD-10-CM

## 2017-06-14 DIAGNOSIS — Z1211 Encounter for screening for malignant neoplasm of colon: Secondary | ICD-10-CM | POA: Insufficient documentation

## 2017-06-14 HISTORY — PX: COLONOSCOPY: SHX5424

## 2017-06-14 HISTORY — PX: POLYPECTOMY: SHX5525

## 2017-06-14 SURGERY — COLONOSCOPY
Anesthesia: Moderate Sedation

## 2017-06-14 MED ORDER — ONDANSETRON HCL 4 MG/2ML IJ SOLN
INTRAMUSCULAR | Status: AC
  Filled 2017-06-14: qty 2

## 2017-06-14 MED ORDER — MEPERIDINE HCL 100 MG/ML IJ SOLN
INTRAMUSCULAR | Status: AC
  Filled 2017-06-14: qty 2

## 2017-06-14 MED ORDER — SODIUM CHLORIDE 0.9 % IV SOLN
INTRAVENOUS | Status: DC
  Administered 2017-06-14: 08:00:00 via INTRAVENOUS

## 2017-06-14 MED ORDER — ONDANSETRON HCL 4 MG/2ML IJ SOLN
INTRAMUSCULAR | Status: DC | PRN
  Administered 2017-06-14: 4 mg via INTRAVENOUS

## 2017-06-14 MED ORDER — MIDAZOLAM HCL 5 MG/5ML IJ SOLN
INTRAMUSCULAR | Status: AC
  Filled 2017-06-14: qty 10

## 2017-06-14 MED ORDER — MEPERIDINE HCL 100 MG/ML IJ SOLN
INTRAMUSCULAR | Status: DC | PRN
  Administered 2017-06-14: 50 mg via INTRAVENOUS
  Administered 2017-06-14 (×2): 25 mg via INTRAVENOUS

## 2017-06-14 MED ORDER — MIDAZOLAM HCL 5 MG/5ML IJ SOLN
INTRAMUSCULAR | Status: DC | PRN
  Administered 2017-06-14: 2 mg via INTRAVENOUS
  Administered 2017-06-14: 1 mg via INTRAVENOUS
  Administered 2017-06-14: 2 mg via INTRAVENOUS
  Administered 2017-06-14: 1 mg via INTRAVENOUS

## 2017-06-14 NOTE — H&P (Signed)
@LOGO @   Primary Care Physician:  Celene Squibb, MD Primary Gastroenterologist:  Dr. Gala Romney  Pre-Procedure History & Physical: HPI:  Justin Santana is a 51 y.o. male here for screening colonoscopy. Rare episodes of rectal bleeding.  Colonoscopy. No family history of colon cancer.  Past Medical History:  Diagnosis Date  . Blind    "born legally blind"  . Cancer ([place])    Skin cancer - melenoma per patient  . Coronary artery disease   . Hx of cardiovascular stress test    ETT/Lexiscan Myoview (10/2013):  diaph atten vs inf scar, no ischemia, EF 56%.  Marland Kitchen Hypertriglyceridemia   . Myocardial infarction ([place])   . Nystagmus     Past Surgical History:  Procedure Laterality Date  . ELBOW SURGERY    . EYE SURGERY    . KNEE SURGERY    . LEFT HEART CATHETERIZATION WITH CORONARY ANGIOGRAM N/A 10/03/2013   Procedure: LEFT HEART CATHETERIZATION WITH CORONARY ANGIOGRAM;  Surgeon: [name], MD;  Location: [facility] CATH LAB;  Service: Cardiovascular;  Laterality: N/A;    Prior to Admission medications   Medication Sig Start Date End Date Taking? Authorizing Provider  aspirin EC 81 MG EC tablet Take 1 tablet (81 mg total) by mouth daily. 10/04/13  Yes Simmons, Brittainy M, PA-C  losartan (COZAAR) 25 MG tablet TAKE 1/2 TABLET (12.5 MG TOTAL) BY MOUTH DAILY. (PLEASE CUT IN HALF FOR PT) 05/06/17  Yes Branch, Alphonse Guild, MD  polyethylene glycol-electrolytes (TRILYTE) 420 g solution Take 4,000 mLs by mouth as directed. 05/13/17  Yes Rourk, Cristopher Estimable, MD  pravastatin (PRAVACHOL) 20 MG tablet Take 1 tablet (20 mg total) by mouth every evening. Patient not taking: Reported on 06/07/2017 01/22/17   Arnoldo Lenis, MD    Allergies as of 05/13/2017 - Review Complete 05/10/2017  Allergen Reaction Noted  . Aleve [naproxen sodium] Other (See Comments) 10/03/2013  . Iodine Other (See Comments) 10/03/2013    Family History  Problem Relation Age of Onset  . Heart disease Father        Pacemaker  . Colon  cancer Neg Hx     Social History   Social History  . Marital status: Divorced    Spouse name: N/A  . Number of children: 1  . Years of education: N/A   Occupational History  . Not on file.   Social History Main Topics  . Smoking status: Current Every Day Smoker    Packs/day: 0.25    Years: 37.00    Types: Cigarettes    Start date: 09/29/1976  . Smokeless tobacco: Never Used     Comment: 15 ciggs per day  . Alcohol use No  . Drug use: No  . Sexual activity: Not on file   Other Topics Concern  . Not on file   Social History Narrative   Lives with daughter.      Review of Systems: See HPI, otherwise negative ROS  Physical Exam: BP 119/78   Pulse 80   Temp 97.8 F (36.6 C) (Oral)   Resp 19   Ht 5\' 11"  (1.803 m)   Wt 200 lb (90.7 kg)   SpO2 92%   BMI 27.89 kg/m  General:   Alert,  Well-developed, well-nourished, pleasant and cooperative in NAD Neck:  Supple; no masses or thyromegaly. No significant cervical adenopathy. Lungs:  Clear throughout to auscultation.   No wheezes, crackles, or rhonchi. No acute distress. Heart:  Regular rate and rhythm; no murmurs, clicks, rubs,  or gallops. Abdomen: Non-distended, normal bowel sounds.  Soft and nontender without appreciable mass or hepatosplenomegaly.  Pulses:  Normal pulses noted. Extremities:  Without clubbing or edema.  Impression/Plan:  Pleasant 51 year old gentleman here for first ever average risk screening colonoscopy. Blood per rectum attributed to hemorrhoids on occasion.  Colonoscopy being done today. The risks, benefits, limitations, alternatives and imponderables have been reviewed with the patient. Questions have been answered. All parties are agreeable.    Notice: This dictation was prepared with Dragon dictation along with smaller phrase technology. Any transcriptional errors that result from this process are unintentional and may not be corrected upon review.

## 2017-06-14 NOTE — Op Note (Signed)
[facility] Patient Name: Justin Santana Procedure Date: 06/14/2017 8:14 AM MRN: [id number] Date of Birth: [birth year] Attending MD: Norvel Richards , MD CSN: [id number] Age: 51 Admit Type: Outpatient Procedure:                Colonoscopy Indications:              Screening for colorectal malignant neoplasm Providers:                Norvel Richards, MD, Lurline Del, RN, Purcell Nails.                            [place], Mer[employer] officer Referring MD:              Medicines:                Midazolam 6 mg IV, Meperidine 100 mg IV,                            Ondansetron 4 mg IV Complications:            No immediate complications. Estimated Blood Loss:     Estimated blood loss was minimal. Procedure:                Pre-Anesthesia Assessment:                           - Prior to the procedure, a History and Physical                            was performed, and patient medications and                            allergies were reviewed. The patient's tolerance of                            previous anesthesia was also reviewed. The risks                            and benefits of the procedure and the sedation                            options and risks were discussed with the patient.                            All questions were answered, and informed consent                            was obtained. Prior Anticoagulants: The patient has                            taken no previous anticoagulant or antiplatelet                            agents. After reviewing the risks and benefits, the  patient was deemed in satisfactory condition to                            undergo the procedure.                           After obtaining informed consent, the colonoscope                            was passed under direct vision. Throughout the                            procedure, the patient's blood pressure, pulse, and                            oxygen saturations were  monitored continuously. The                            EC-[id number] ([id number]) scope was introduced through                            the anus and advanced to the the cecum, identified                            by appendiceal orifice and ileocecal valve. The                            colonoscopy was performed without difficulty. The                            patient tolerated the procedure well. The ileocecal                            valve, appendiceal orifice, and rectum were                            photographed. The entire colon was well visualized.                            The quality of the bowel preparation was adequate. Scope In: 8:27:51 AM Scope Out: 8:50:57 AM Scope Withdrawal Time: 0 hours 16 minutes 10 seconds  Total Procedure Duration: 0 hours 23 minutes 6 seconds  Findings:      The perianal and digital rectal examinations were normal.      (2) 4 mm polyp was found in the rectum. The polyp was sessile. The       polyps were removed with a cold snare. Resection and retrieval were       complete. Estimated blood loss was minimal.      Four semi-pedunculated polyps were found in the splenic flexure. The       polyps were 5 to 6 mm in size. These polyps were removed with a cold       snare. Resection and retrieval were complete. Estimated blood loss: none.      A 8 mm polyp was found in the splenic flexure.  The polyp was       pedunculated. The polyp was removed with a hot snare. Resection and       retrieval were complete. Estimated blood loss: none.      The exam was otherwise without abnormality on direct and retroflexion       views. Impression:               - 2 - 4 mm polyps in the rectum, removed with a                            cold snare. Resected and retrieved.                           - Four 5 to 6 mm polyps at the splenic flexure,                            removed with a cold snare. Resected and retrieved.                           - One 8 mm polyp at the  splenic flexure, removed                            with a hot snare. Resected and retrieved.                           - The examination was otherwise normal on direct                            and retroflexion views. Moderate Sedation:      Moderate (conscious) sedation was administered by the endoscopy nurse       and supervised by the endoscopist. The following parameters were       monitored: oxygen saturation, heart rate, blood pressure, respiratory       rate, EKG, adequacy of pulmonary ventilation, and response to care.       Total physician intraservice time was 32 minutes. Recommendation:           - Patient has a contact number available for                            emergencies. The signs and symptoms of potential                            delayed complications were discussed with the                            patient. Return to normal activities tomorrow.                            Written discharge instructions were provided to the                            patient.                           -  Resume previous diet.                           - Continue present medications.                           - Repeat colonoscopy date to be determined after                            pending pathology results are reviewed for                            surveillance.                           - Return to GI clinic (date not yet determined). Procedure Code(s):        --- Professional ---                           [phone], Colonoscopy, flexible; with removal of                            tumor(s), polyp(s), or other lesion(s) by snare                            technique                           99152, Moderate sedation services provided by the                            same physician or other qualified health care                            professional performing the diagnostic or                            therapeutic service that the sedation supports,                             requiring the presence of an independent trained                            observer to assist in the monitoring of the                            patient's level of consciousness and physiological                            status; initial 15 minutes of intraservice time,                            patient age 54 years or older                           99153, Moderate sedation  services; each additional                            15 minutes intraservice time Diagnosis Code(s):        --- Professional ---                           K62.1, Rectal polyp                           D12.3, Benign neoplasm of transverse colon (hepatic                            flexure or splenic flexure) CPT copyright 2016 American Medical Association. All rights reserved. The codes documented in this report are preliminary and upon coder review may  be revised to meet current compliance requirements. Cristopher Estimable. Hermenegildo Clausen, MD Norvel Richards, MD 06/14/2017 8:59:12 AM This report has been signed electronically. Number of Addenda: 0

## 2017-06-14 NOTE — Discharge Instructions (Addendum)
°Colonoscopy °Discharge Instructions ° °Read the instructions outlined below and refer to this sheet in the next few weeks. These discharge instructions provide you with general information on caring for yourself after you leave the hospital. Your doctor may also give you specific instructions. While your treatment has been planned according to the most current medical practices available, unavoidable complications occasionally occur. If you have any problems or questions after discharge, call Dr. Rourk at [phone]. °ACTIVITY °· You may resume your regular activity, but move at a slower pace for the next 24 hours.  °· Take frequent rest periods for the next 24 hours.  °· Walking will help get rid of the air and reduce the bloated feeling in your belly (abdomen).  °· No driving for 24 hours (because of the medicine (anesthesia) used during the test).   °· Do not sign any important legal documents or operate any machinery for 24 hours (because of the anesthesia used during the test).  °NUTRITION °· Drink plenty of fluids.  °· You may resume your normal diet as instructed by your doctor.  °· Begin with a light meal and progress to your normal diet. Heavy or fried foods are harder to digest and may make you feel sick to your stomach (nauseated).  °· Avoid alcoholic beverages for 24 hours or as instructed.  °MEDICATIONS °· You may resume your normal medications unless your doctor tells you otherwise.  °WHAT YOU CAN EXPECT TODAY °· Some feelings of bloating in the abdomen.  °· Passage of more gas than usual.  °· Spotting of blood in your stool or on the toilet paper.  °IF YOU HAD POLYPS REMOVED DURING THE COLONOSCOPY: °· No aspirin products for 7 days or as instructed.  °· No alcohol for 7 days or as instructed.  °· Eat a soft diet for the next 24 hours.  °FINDING OUT THE RESULTS OF YOUR TEST °Not all test results are available during your visit. If your test results are not back during the visit, make an appointment  with your caregiver to find out the results. Do not assume everything is normal if you have not heard from your caregiver or the medical facility. It is important for you to follow up on all of your test results.  °SEEK IMMEDIATE MEDICAL ATTENTION IF: °· You have more than a spotting of blood in your stool.  °· Your belly is swollen (abdominal distention).  °· You are nauseated or vomiting.  °· You have a temperature over 101.  °· You have abdominal pain or discomfort that is severe or gets worse throughout the day.  ° ° °Colon polyp information provided ° °Further recommendations to follow pending review of pathology report. ° ° °Colon Polyps °Polyps are tissue growths inside the body. Polyps can grow in many places, including the large intestine (colon). A polyp may be a round bump or a mushroom-shaped growth. You could have one polyp or several. °Most colon polyps are noncancerous (benign). However, some colon polyps can become cancerous over time. °What are the causes? °The exact cause of colon polyps is not known. °What increases the risk? °This condition is more likely to develop in people who: °· Have a family history of colon cancer or colon polyps. °· Are older than 50 or older than 45 if they are African American. °· Have inflammatory bowel disease, such as ulcerative colitis or Crohn disease. °· Are overweight. °· Smoke cigarettes. °· Do not get enough exercise. °· Drink too much alcohol. °·   Eat a diet that is: °? High in fat and red meat. °? Low in fiber. °· Had childhood cancer that was treated with abdominal radiation. ° °What are the signs or symptoms? °Most polyps do not cause symptoms. If you have symptoms, they may include: °· Blood coming from your rectum when having a bowel movement. °· Blood in your stool. The stool may look dark red or black. °· A change in bowel habits, such as constipation or diarrhea. ° °How is this diagnosed? °This condition is diagnosed with a colonoscopy. This is a  procedure that uses a lighted, flexible scope to look at the inside of your colon. °How is this treated? °Treatment for this condition involves removing any polyps that are found. Those polyps will then be tested for cancer. If cancer is found, your health care provider will talk to you about options for colon cancer treatment. °Follow these instructions at home: °Diet °· Eat plenty of fiber, such as fruits, vegetables, and whole grains. °· Eat foods that are high in calcium and vitamin D, such as milk, cheese, yogurt, eggs, liver, fish, and broccoli. °· Limit foods high in fat, red meats, and processed meats, such as hot dogs, sausage, bacon, and lunch meats. °· Maintain a healthy weight, or lose weight if recommended by your health care provider. °General instructions °· Do not smoke cigarettes. °· Do not drink alcohol excessively. °· Keep all follow-up visits as told by your health care provider. This is important. This includes keeping regularly scheduled colonoscopies. Talk to your health care provider about when you need a colonoscopy. °· Exercise every day or as told by your health care provider. °Contact a health care provider if: °· You have new or worsening bleeding during a bowel movement. °· You have new or increased blood in your stool. °· You have a change in bowel habits. °· You unexpectedly lose weight. °This information is not intended to replace advice given to you by your health care provider. Make sure you discuss any questions you have with your health care provider. °Document Released: 05/02/2004 Document Revised: 01/12/2016 Document Reviewed: 06/27/2015 °Elsevier Interactive Patient Education © 2018 Elsevier Inc. ° °

## 2017-06-18 ENCOUNTER — Encounter (HOSPITAL_COMMUNITY): Payer: Self-pay | Admitting: Internal Medicine

## 2017-06-20 ENCOUNTER — Encounter: Payer: Self-pay | Admitting: Internal Medicine

## 2017-06-24 ENCOUNTER — Telehealth: Payer: Self-pay

## 2017-06-24 NOTE — Telephone Encounter (Signed)
Pt called wanting his results. Results were given, pt notified that his results were mailed off today.

## 2017-08-06 ENCOUNTER — Ambulatory Visit: Payer: BC Managed Care – PPO | Admitting: Nurse Practitioner

## 2017-09-16 ENCOUNTER — Encounter: Payer: Self-pay | Admitting: Internal Medicine

## 2017-09-30 ENCOUNTER — Encounter: Payer: Self-pay | Admitting: Nurse Practitioner

## 2017-09-30 ENCOUNTER — Ambulatory Visit: Payer: BC Managed Care – PPO | Admitting: Nurse Practitioner

## 2017-09-30 ENCOUNTER — Telehealth: Payer: Self-pay | Admitting: Nurse Practitioner

## 2017-09-30 NOTE — Progress Notes (Deleted)
Referring Provider: Celene Squibb, MD Primary Care Physician:  Celene Squibb, MD Primary GI:  Dr. Gala Romney  No chief complaint on file.   HPI:   Justin Santana is a 52 y.o. male who presents for follow-up on hemorrhoids and rectal bleeding.  The patient was last seen in our office 05/10/2017 for the same.  At his last visit he was doing okay, noted history of hemorrhoids with scant toilet tissue hematochezia for the previous 6 months.  Preparation H was not helping but Vaseline was.  Denies constipation.  Denied current hemorrhoid flare but "starting to show up again."  Felt his hemorrhoids are due to heavy lifting at his job.  No other GI symptoms.  Recommended colonoscopy for first-ever exam and diagnostic for rectal bleeding, notify us if he would like Anusol rectal cream, follow-up in 6 months.  Colonoscopy completed 06/14/2017 which found two 4 mm polyps in the rectum, four 5-6 mm polyps in the splenic flexure, a single 8 mm polyp in the splenic flexure, otherwise normal.  Pathology found the polyps to be single hyperplastic polyp and the remaining polyps tubular adenoma.  Recommended continue current medications and repeat colonoscopy in 3 years (2021).  Today states   Past Medical History:  Diagnosis Date  . Blind    "born legally blind"  . Cancer ([place])    Skin cancer - melenoma per patient  . Coronary artery disease   . Hx of cardiovascular stress test    ETT/Lexiscan Myoview (10/2013):  diaph atten vs inf scar, no ischemia, EF 56%.  Marland Kitchen Hypertriglyceridemia   . Myocardial infarction ([place])   . Nystagmus     Past Surgical History:  Procedure Laterality Date  . COLONOSCOPY N/A 06/14/2017   Procedure: COLONOSCOPY;  Surgeon: [name], MD;  Location: [facility];  Service: Endoscopy;  Laterality: N/A;  830   . ELBOW SURGERY    . EYE SURGERY    . KNEE SURGERY    . LEFT HEART CATHETERIZATION WITH CORONARY ANGIOGRAM N/A 10/03/2013   Procedure: LEFT HEART CATHETERIZATION WITH  CORONARY ANGIOGRAM;  Surgeon: [name], MD;  Location: [facility] CATH LAB;  Service: Cardiovascular;  Laterality: N/A;  . POLYPECTOMY  06/14/2017   Procedure: POLYPECTOMY;  Surgeon: [name], MD;  Location: [facility];  Service: Endoscopy;;  splenic flexure x5; rectal x2    Current Outpatient Medications  Medication Sig Dispense Refill  . aspirin EC 81 MG EC tablet Take 1 tablet (81 mg total) by mouth daily.    Marland Kitchen losartan (COZAAR) 25 MG tablet TAKE 1/2 TABLET (12.5 MG TOTAL) BY MOUTH DAILY. (PLEASE CUT IN HALF FOR PT) 45 tablet 3  . polyethylene glycol-electrolytes (TRILYTE) 420 g solution Take 4,000 mLs by mouth as directed. 4000 mL 0   No current facility-administered medications for this visit.     Allergies as of 09/30/2017 - Review Complete 06/14/2017  Allergen Reaction Noted  . Aleve [naproxen sodium] Shortness Of Breath and Other (See Comments) 10/03/2013  . Iodine Other (See Comments) 10/03/2013    Family History  Problem Relation Age of Onset  . Heart disease Father        Pacemaker  . Colon cancer Neg Hx     Social History   Socioeconomic History  . Marital status: Divorced    Spouse name: Not on file  . Number of children: 1  . Years of education: Not on file  . Highest education level: Not on file  Social Needs  . Financial resource strain: Not on file  . Food insecurity - worry: Not on file  . Food insecurity - inability: Not on file  . Transportation needs - medical: Not on file  . Transportation needs - non-medical: Not on file  Occupational History  . Not on file  Tobacco Use  . Smoking status: Current Every Day Smoker    Packs/day: 0.25    Years: 37.00    Pack years: 9.25    Types: Cigarettes    Start date: 09/29/1976  . Smokeless tobacco: Never Used  . Tobacco comment: 15 ciggs per day  Substance and Sexual Activity  . Alcohol use: No    Alcohol/week: 0.0 oz  . Drug use: No  . Sexual activity: Not on file  Other Topics Concern  . Not  on file  Social History Narrative   Lives with daughter.      Review of Systems: General: Negative for anorexia, weight loss, fever, chills, fatigue, weakness. Eyes: Negative for vision changes.  ENT: Negative for hoarseness, difficulty swallowing , nasal congestion. CV: Negative for chest pain, angina, palpitations, dyspnea on exertion, peripheral edema.  Respiratory: Negative for dyspnea at rest, dyspnea on exertion, cough, sputum, wheezing.  GI: See history of present illness. GU:  Negative for dysuria, hematuria, urinary incontinence, urinary frequency, nocturnal urination.  MS: Negative for joint pain, low back pain.  Derm: Negative for rash or itching.  Neuro: Negative for weakness, abnormal sensation, seizure, frequent headaches, memory loss, confusion.  Psych: Negative for anxiety, depression, suicidal ideation, hallucinations.  Endo: Negative for unusual weight change.  Heme: Negative for bruising or bleeding. Allergy: Negative for rash or hives.   Physical Exam: There were no vitals taken for this visit. General:   Alert and oriented. Pleasant and cooperative. Well-nourished and well-developed.  Head:  Normocephalic and atraumatic. Eyes:  Without icterus, sclera clear and conjunctiva pink.  Ears:  Normal auditory acuity. Mouth:  No deformity or lesions, oral mucosa pink.  Throat/Neck:  Supple, without mass or thyromegaly. Cardiovascular:  S1, S2 present without murmurs appreciated. Normal pulses noted. Extremities without clubbing or edema. Respiratory:  Clear to auscultation bilaterally. No wheezes, rales, or rhonchi. No distress.  Gastrointestinal:  +BS, soft, non-tender and non-distended. No HSM noted. No guarding or rebound. No masses appreciated.  Rectal:  Deferred  Musculoskalatal:  Symmetrical without gross deformities. Normal posture. Skin:  Intact without significant lesions or rashes. Neurologic:  Alert and oriented x4;  grossly normal neurologically. Psych:   Alert and cooperative. Normal mood and affect. Heme/Lymph/Immune: No significant cervical adenopathy. No excessive bruising noted.    09/30/2017 11:44 AM   Disclaimer: This note was dictated with voice recognition software. Similar sounding words can inadvertently be transcribed and may not be corrected upon review.

## 2017-09-30 NOTE — Telephone Encounter (Signed)
PATIENT WAS A NO SHOW AND LETTER SENT  °

## 2017-10-03 NOTE — Telephone Encounter (Signed)
Noted  

## 2018-01-23 ENCOUNTER — Emergency Department (HOSPITAL_COMMUNITY): Payer: BC Managed Care – PPO

## 2018-01-23 ENCOUNTER — Encounter (HOSPITAL_COMMUNITY): Payer: Self-pay | Admitting: Emergency Medicine

## 2018-01-23 ENCOUNTER — Observation Stay (HOSPITAL_COMMUNITY)
Admission: EM | Admit: 2018-01-23 | Discharge: 2018-01-24 | Disposition: A | Discharge status: Home / Self Care | Payer: BC Managed Care – PPO | Attending: Internal Medicine | Admitting: Internal Medicine

## 2018-01-23 ENCOUNTER — Other Ambulatory Visit: Payer: Self-pay

## 2018-01-23 DIAGNOSIS — E7800 Pure hypercholesterolemia, unspecified: Secondary | ICD-10-CM

## 2018-01-23 DIAGNOSIS — E785 Hyperlipidemia, unspecified: Secondary | ICD-10-CM | POA: Diagnosis present

## 2018-01-23 DIAGNOSIS — I25110 Atherosclerotic heart disease of native coronary artery with unstable angina pectoris: Secondary | ICD-10-CM | POA: Diagnosis present

## 2018-01-23 DIAGNOSIS — Z79899 Other long term (current) drug therapy: Secondary | ICD-10-CM | POA: Insufficient documentation

## 2018-01-23 DIAGNOSIS — F17210 Nicotine dependence, cigarettes, uncomplicated: Secondary | ICD-10-CM | POA: Insufficient documentation

## 2018-01-23 DIAGNOSIS — Z85828 Personal history of other malignant neoplasm of skin: Secondary | ICD-10-CM | POA: Insufficient documentation

## 2018-01-23 DIAGNOSIS — Z7982 Long term (current) use of aspirin: Secondary | ICD-10-CM | POA: Insufficient documentation

## 2018-01-23 DIAGNOSIS — R079 Chest pain, unspecified: Secondary | ICD-10-CM | POA: Diagnosis not present

## 2018-01-23 DIAGNOSIS — Z720 Tobacco use: Secondary | ICD-10-CM

## 2018-01-23 DIAGNOSIS — I252 Old myocardial infarction: Secondary | ICD-10-CM | POA: Insufficient documentation

## 2018-01-23 DIAGNOSIS — I2510 Atherosclerotic heart disease of native coronary artery without angina pectoris: Secondary | ICD-10-CM | POA: Diagnosis not present

## 2018-01-23 DIAGNOSIS — R61 Generalized hyperhidrosis: Secondary | ICD-10-CM | POA: Diagnosis not present

## 2018-01-23 DIAGNOSIS — R0602 Shortness of breath: Secondary | ICD-10-CM | POA: Diagnosis not present

## 2018-01-23 DIAGNOSIS — R0789 Other chest pain: Secondary | ICD-10-CM | POA: Diagnosis not present

## 2018-01-23 DIAGNOSIS — I200 Unstable angina: Secondary | ICD-10-CM | POA: Diagnosis not present

## 2018-01-23 DIAGNOSIS — R110 Nausea: Secondary | ICD-10-CM | POA: Insufficient documentation

## 2018-01-23 DIAGNOSIS — I213 ST elevation (STEMI) myocardial infarction of unspecified site: Secondary | ICD-10-CM | POA: Diagnosis not present

## 2018-01-23 DIAGNOSIS — I712 Thoracic aortic aneurysm, without rupture: Secondary | ICD-10-CM | POA: Diagnosis not present

## 2018-01-23 DIAGNOSIS — R402411 Glasgow coma scale score 13-15, in the field [EMT or ambulance]: Secondary | ICD-10-CM | POA: Diagnosis not present

## 2018-01-23 HISTORY — DX: Pneumonia, unspecified organism: J18.9

## 2018-01-23 HISTORY — DX: Headache, unspecified: R51.9

## 2018-01-23 HISTORY — DX: Unspecified malignant neoplasm of skin, unspecified: C44.90

## 2018-01-23 HISTORY — DX: Malignant melanoma of skin, unspecified: C43.9

## 2018-01-23 HISTORY — DX: Other intervertebral disc degeneration, lumbosacral region: M51.37

## 2018-01-23 HISTORY — DX: Major depressive disorder, single episode, unspecified: F32.9

## 2018-01-23 HISTORY — DX: Gastro-esophageal reflux disease without esophagitis: K21.9

## 2018-01-23 HISTORY — DX: Other cervical disc degeneration, unspecified cervical region: M50.30

## 2018-01-23 HISTORY — DX: Depression, unspecified: F32.A

## 2018-01-23 HISTORY — DX: Anxiety disorder, unspecified: F41.9

## 2018-01-23 HISTORY — DX: Headache: R51

## 2018-01-23 HISTORY — DX: Other intervertebral disc degeneration, lumbosacral region without mention of lumbar back pain or lower extremity pain: M51.379

## 2018-01-23 LAB — CBC WITH DIFFERENTIAL/PLATELET
Abs Immature Granulocytes: 0 10*3/uL (ref 0.0–0.1)
Basophils Absolute: 0.1 10*3/uL (ref 0.0–0.1)
Basophils Relative: 1 %
Eosinophils Absolute: 0.2 10*3/uL (ref 0.0–0.7)
Eosinophils Relative: 2 %
HCT: 44.7 % (ref 39.0–52.0)
Hemoglobin: 15.2 g/dL (ref 13.0–17.0)
Immature Granulocytes: 0 %
Lymphocytes Relative: 24 %
Lymphs Abs: 2.4 10*3/uL (ref 0.7–4.0)
MCH: 30.6 pg (ref 26.0–34.0)
MCHC: 34 g/dL (ref 30.0–36.0)
MCV: 89.9 fL (ref 78.0–100.0)
Monocytes Absolute: 0.6 10*3/uL (ref 0.1–1.0)
Monocytes Relative: 6 %
Neutro Abs: 6.7 10*3/uL (ref 1.7–7.7)
Neutrophils Relative %: 67 %
Platelets: 147 10*3/uL — ABNORMAL LOW (ref 150–400)
RBC: 4.97 MIL/uL (ref 4.22–5.81)
RDW: 12.6 % (ref 11.5–15.5)
WBC: 10 10*3/uL (ref 4.0–10.5)

## 2018-01-23 LAB — MAGNESIUM: Magnesium: 1.9 mg/dL (ref 1.7–2.4)

## 2018-01-23 LAB — LIPID PANEL
Cholesterol: 202 mg/dL — ABNORMAL HIGH (ref 0–200)
HDL: 28 mg/dL — ABNORMAL LOW (ref >40)
LDL Cholesterol: 132 mg/dL — ABNORMAL HIGH (ref 0–99)
Total CHOL/HDL Ratio: 7.2 RATIO
Triglycerides: 209 mg/dL — ABNORMAL HIGH (ref <150)
VLDL: 42 mg/dL — ABNORMAL HIGH (ref 0–40)

## 2018-01-23 LAB — HEMOGLOBIN A1C
Hgb A1c MFr Bld: 5.9 % — ABNORMAL HIGH (ref 4.8–5.6)
Mean Plasma Glucose: 122.63 mg/dL

## 2018-01-23 LAB — BASIC METABOLIC PANEL
Anion gap: 8 (ref 5–15)
BUN: 17 mg/dL (ref 6–20)
CO2: 25 mmol/L (ref 22–32)
Calcium: 8.8 mg/dL — ABNORMAL LOW (ref 8.9–10.3)
Chloride: 106 mmol/L (ref 101–111)
Creatinine, Ser: 1.12 mg/dL (ref 0.61–1.24)
GFR calc Af Amer: >60 mL/min (ref >60)
GFR calc non Af Amer: >60 mL/min (ref >60)
Glucose, Bld: 90 mg/dL (ref 65–99)
Potassium: 4.9 mmol/L (ref 3.5–5.1)
Sodium: 139 mmol/L (ref 135–145)

## 2018-01-23 LAB — TROPONIN I
Troponin I: <0.03 ng/mL (ref <0.03)
Troponin I: <0.03 ng/mL (ref <0.03)

## 2018-01-23 LAB — TSH: TSH: 1.746 u[IU]/mL (ref 0.350–4.500)

## 2018-01-23 LAB — I-STAT TROPONIN, ED: Troponin i, poc: 0 ng/mL (ref 0.00–0.08)

## 2018-01-23 MED ORDER — ACETAMINOPHEN 650 MG RE SUPP: 650.0000 mg | Freq: Four times a day (QID) | RECTAL | Status: DC | PRN

## 2018-01-23 MED ORDER — MORPHINE SULFATE (PF) 4 MG/ML IV SOLN: 4.0000 mg | Freq: Once | INTRAVENOUS | Status: DC

## 2018-01-23 MED ORDER — SODIUM CHLORIDE 0.9 % IV BOLUS
1000.0000 mL | Freq: Once | INTRAVENOUS | Status: AC
  Administered 2018-01-23: 1000 mL via INTRAVENOUS

## 2018-01-23 MED ORDER — ONDANSETRON HCL 4 MG/2ML IJ SOLN: 4.0000 mg | Freq: Four times a day (QID) | INTRAMUSCULAR | Status: DC | PRN

## 2018-01-23 MED ORDER — ASPIRIN 81 MG PO CHEW
81.0000 mg | CHEWABLE_TABLET | Freq: Every day | ORAL | Status: DC
  Administered 2018-01-24: 81 mg via ORAL
  Filled 2018-01-23: qty 1

## 2018-01-23 MED ORDER — IOPAMIDOL (ISOVUE-370) INJECTION 76%
INTRAVENOUS | Status: AC
  Filled 2018-01-23: qty 100

## 2018-01-23 MED ORDER — IOPAMIDOL (ISOVUE-370) INJECTION 76%
100.0000 mL | Freq: Once | INTRAVENOUS | Status: AC | PRN
  Administered 2018-01-23: 100 mL via INTRAVENOUS

## 2018-01-23 MED ORDER — ACETAMINOPHEN 325 MG PO TABS: 650.0000 mg | ORAL_TABLET | Freq: Four times a day (QID) | ORAL | Status: DC | PRN

## 2018-01-23 MED ORDER — ENOXAPARIN SODIUM 40 MG/0.4ML ~~LOC~~ SOLN
40.0000 mg | SUBCUTANEOUS | Status: DC
  Administered 2018-01-23: 40 mg via SUBCUTANEOUS
  Filled 2018-01-23: qty 0.4

## 2018-01-23 MED ORDER — ONDANSETRON HCL 4 MG PO TABS: 4.0000 mg | ORAL_TABLET | Freq: Four times a day (QID) | ORAL | Status: DC | PRN

## 2018-01-23 MED ORDER — LEVALBUTEROL HCL 0.63 MG/3ML IN NEBU: 0.6300 mg | INHALATION_SOLUTION | Freq: Four times a day (QID) | RESPIRATORY_TRACT | Status: DC | PRN

## 2018-01-23 NOTE — ED Triage Notes (Addendum)
Pt arrives via EMS with reports of back pain and SOB starting at 3 am. Pt reports MI in 2014 and stent in 2015 where he was supposed to follow-up with cards for another stent in 2018. Per EMS, pt was pale and diaphoretic. Pt has had 4 of ASA and 2 nitro. Pain 3/10 now.

## 2018-01-23 NOTE — ED Provider Notes (Signed)
[facility] EMERGENCY DEPARTMENT Provider Note   CSN: [id number] Arrival date & time: 01/23/18  1027     History   Chief Complaint Chief Complaint  Patient presents with  . Chest Pain  . Shortness of Breath    HPI Justin Santana is a 52 y.o. male past medical history of coronary artery disease, MI (2015), hypertriglyceridemia who presents via EMS for evaluation of chest pain and shortness of breath that began approximately 3 AM.  Patient reports that when he woke up, he was having some pain noted to the back and left side of his chest.  Patient thought it was a muscle spasm and attempted to stretch to help with symptoms.  Patient reports that he went to work in approximately 9 AM, his symptoms worsened.  Patient states that his chest pain became more severe.  He does state that it radiates to his back.  Patient reports that when the pain worsens, he began getting diaphoretic and nauseous.  Additionally, patient had difficulty breathing.  On EMS arrival, patient was pale and diaphoretic.  Patient states that it felt like when he had a previous heart attack.  Patient reports that initially, his pain was 8/10.  EMS gave aspirin and nitro x2 in route.  Medications, his pain improved to a 5/10 and then a 3/10 on ED arrival.  Patient reports that he does currently smoke.  He states he smokes 1 pack a day.  Denies any cocaine use.  Does not have a history of hypertension or diabetes.  Patient does have a history of prior MI in 2015 with a stent in place.  In 2018 he was seen by his cardiologist (Dr. [name]) who had recommended doing an additional stent.  Patient was unable due to social circumstances that prevented him.  Patient reports he has not gotten it done.  He states that he has been in his normal state of health prior to onset of symptoms.  Patient denies any fevers, abdominal pain, nausea/vomiting, numbness/weakness.  The history is provided by the patient.    Past Medical  History:  Diagnosis Date  . Blind    "born legally blind"  . Cancer ([place])    Skin cancer - melenoma per patient  . Coronary artery disease   . Hx of cardiovascular stress test    ETT/Lexiscan Myoview (10/2013):  diaph atten vs inf scar, no ischemia, EF 56%.  Marland Kitchen Hypertriglyceridemia   . Myocardial infarction ([place])   . Nystagmus     Patient Active Problem List   Diagnosis Date Noted  . Unstable angina ([place]) 01/23/2018  . Chest pain 01/23/2018  . Hemorrhoids 05/10/2017  . Rectal bleeding 05/10/2017  . History of ST elevation myocardial infarction (STEMI) 10/12/2013  . Coronary atherosclerosis of native coronary artery 10/12/2013  . HLD (hyperlipidemia) 10/12/2013  . Acute myocardial infarction ([place]) 10/03/2013  . Tobacco abuse 10/03/2013    Past Surgical History:  Procedure Laterality Date  . COLONOSCOPY N/A 06/14/2017   Procedure: COLONOSCOPY;  Surgeon: [name], MD;  Location: [facility];  Service: Endoscopy;  Laterality: N/A;  830   . ELBOW SURGERY    . EYE SURGERY    . KNEE SURGERY    . LEFT HEART CATHETERIZATION WITH CORONARY ANGIOGRAM N/A 10/03/2013   Procedure: LEFT HEART CATHETERIZATION WITH CORONARY ANGIOGRAM;  Surgeon: [name], MD;  Location: [facility] CATH LAB;  Service: Cardiovascular;  Laterality: N/A;  . POLYPECTOMY  06/14/2017   Procedure: POLYPECTOMY;  Surgeon: Gala Romney,  Cristopher Estimable, MD;  Location: [facility];  Service: Endoscopy;;  splenic flexure x5; rectal x2        Home Medications    Prior to Admission medications   Medication Sig Start Date End Date Taking? Authorizing Provider  aspirin EC 81 MG EC tablet Take 1 tablet (81 mg total) by mouth daily. 10/04/13  Yes Simmons, Brittainy M, PA-C  losartan (COZAAR) 25 MG tablet TAKE 1/2 TABLET (12.5 MG TOTAL) BY MOUTH DAILY. (PLEASE CUT IN HALF FOR PT) 05/06/17  Yes Branch, Alphonse Guild, MD  polyethylene glycol-electrolytes (TRILYTE) 420 g solution Take 4,000 mLs by mouth as directed. Patient not taking:  Reported on 01/23/2018 05/13/17   Rourk, Cristopher Estimable, MD    Family History Family History  Problem Relation Age of Onset  . Heart disease Father        Pacemaker  . Colon cancer Neg Hx     Social History Social History   Tobacco Use  . Smoking status: Current Every Day Smoker    Packs/day: 0.25    Years: 37.00    Pack years: 9.25    Types: Cigarettes    Start date: 09/29/1976  . Smokeless tobacco: Never Used  . Tobacco comment: 15 ciggs per day  Substance Use Topics  . Alcohol use: No    Alcohol/week: 0.0 oz  . Drug use: No     Allergies   Aleve [naproxen sodium] and Iodine   Review of Systems Review of Systems  Constitutional: Positive for diaphoresis. Negative for chills and fever.  HENT: Negative for congestion.   Respiratory: Positive for chest tightness and shortness of breath. Negative for cough.   Cardiovascular: Negative for chest pain.  Gastrointestinal: Positive for nausea. Negative for abdominal pain, diarrhea and vomiting.  Genitourinary: Negative for dysuria and hematuria.  Musculoskeletal: Negative for back pain and neck pain.  Skin: Negative for rash.  Neurological: Negative for dizziness, weakness, numbness and headaches.  All other systems reviewed and are negative.    Physical Exam Updated Vital Signs BP 117/73   Pulse 66   Temp 97.6 F (36.4 C) (Oral)   Resp 18   Ht 5\' 10"  (1.778 m)   Wt 90.7 kg (200 lb)   SpO2 95%   BMI 28.70 kg/m   Physical Exam  Constitutional: He is oriented to person, place, and time. He appears well-developed and well-nourished.  HENT:  Head: Normocephalic and atraumatic.  Mouth/Throat: Oropharynx is clear and moist and mucous membranes are normal.  Eyes: Pupils are equal, round, and reactive to light. Conjunctivae, EOM and lids are normal.  Neck: Full passive range of motion without pain.  Cardiovascular: Normal rate, regular rhythm, normal heart sounds and normal pulses. Exam reveals no gallop and no friction  rub.  No murmur heard. Pulses:      Radial pulses are 2+ on the right side, and 2+ on the left side.       Dorsalis pedis pulses are 2+ on the right side, and 2+ on the left side.  Pulmonary/Chest: Effort normal and breath sounds normal.  Lungs clear to auscultation bilaterally.  Symmetric chest rise.  No wheezing, rales, rhonchi.  Abdominal: Soft. Normal appearance. There is no tenderness. There is no rigidity and no guarding.  Abdomen is soft, non-distended, non-tender. No rigidity, No guarding. No peritoneal signs.  Musculoskeletal: Normal range of motion.  BLE are symmetric in appearance. No overlying edema, erythema.   Neurological: He is alert and oriented to person, place, and  time.  Skin: Skin is warm and dry. Capillary refill takes less than 2 seconds.  Good distal cap refill.   Psychiatric: He has a normal mood and affect. His speech is normal.  Nursing note and vitals reviewed.    ED Treatments / Results  Labs (all labs ordered are listed, but only abnormal results are displayed) Labs Reviewed  CBC WITH DIFFERENTIAL/PLATELET - Abnormal; Notable for the following components:      Result Value   Platelets 147 (*)    All other components within normal limits  BASIC METABOLIC PANEL - Abnormal; Notable for the following components:   Calcium 8.8 (*)    All other components within normal limits  HIV ANTIBODY (ROUTINE TESTING)  MAGNESIUM  TSH  TROPONIN I  TROPONIN I  TROPONIN I  HEMOGLOBIN A1C  LIPID PANEL  I-STAT TROPONIN, ED    EKG EKG Interpretation  Date/Time:  Thursday January 23 2018 10:35:16 EDT Ventricular Rate:  77 PR Interval:    QRS Duration: 98 QT Interval:  383 QTC Calculation: 434 R Axis:   -2 Text Interpretation:  Sinus rhythm RSR' in V1 or V2, probably normal variant Confirmed by Lacretia Leigh ([id number]) on 01/23/2018 11:40:10 AM   Radiology Dg Chest 2 View  Result Date: 01/23/2018 CLINICAL DATA:  Chest and left shoulder pain since this morning  with shortness of breath. Smoker. EXAM: CHEST - 2 VIEW COMPARISON:  11/28/2017. FINDINGS: Normal sized heart. Clear lungs. Stable diffusely prominent interstitial markings. Normal vascularity. Minimal thoracolumbar spine degenerative changes. IMPRESSION: No acute abnormality. Stable chronic interstitial lung disease compatible with the history of smoking. Electronically Signed   By: Claudie Revering M.D.   On: 01/23/2018 11:23   Ct Angio Chest Pe W And/or Wo Contrast  Result Date: 01/23/2018 CLINICAL DATA:  Shortness of breath and back pain since 0300 hours, history coronary disease post MI, hypertension, smoker EXAM: CT ANGIOGRAPHY CHEST WITH CONTRAST TECHNIQUE: Multidetector CT imaging of the chest was performed using the standard protocol during bolus administration of intravenous contrast. Multiplanar CT image reconstructions and MIPs were obtained to evaluate the vascular anatomy. CONTRAST:  160mL ISOVUE-370 IOPAMIDOL (ISOVUE-370) INJECTION 76% IV COMPARISON:  None FINDINGS: Cardiovascular: Mild aneurysmal dilatation of the ascending thoracic aorta 4.0 cm diameter. No aortic dissection. Coronary arterial calcifications present. No pericardial effusion. Excellent opacification of the pulmonary arteries. No pulmonary arterial filling defects identified to suggest pulmonary embolism. Mediastinum/Nodes: Esophagus unremarkable. Base of cervical region normal appearance. Few normal sized mediastinal and RIGHT hilar lymph nodes. Without thoracic adenopathy. Lungs/Pleura: Central peribronchial thickening. Peripheral area of nodularity versus atelectasis or infiltrate RIGHT middle lobe 14 x 6 x 49mm diameter image 51. Minimal atelectasis RIGHT base. Remaining lungs clear. No additional pulmonary infiltrate, pleural effusion, or pneumothorax. Upper Abdomen: Visualized upper abdomen unremarkable. Musculoskeletal: No acute osseous findings. Review of the MIP images confirms the above findings. IMPRESSION: No evidence of  pulmonary embolism. 14 x 6 x 5 mm area of subpleural nodularity versus atelectasis or infiltrate in RIGHT middle lobe adjacent to minor fissure; short-term follow-up CT chest recommended in 4 weeks to assess for persistence. Atherosclerotic changes including coronary arterial calcification and aneurysmal dilatation of the ascending thoracic aorta 4.0 cm greatest size; recommendation below. Recommend annual imaging followup by CTA or MRA. This recommendation follows 2010 ACCF/AHA/AATS/ACR/ASA/SCA/SCAI/SIR/STS/SVM Guidelines for the Diagnosis and Management of Patients with Thoracic Aortic Disease. Circulation. 2010; 121: V253-G644 Aortic Atherosclerosis (ICD10-I70.0). Aortic aneurysm NOS (ICD10-I71.9). Electronically Signed   By: Crist Infante.D.  On: 01/23/2018 14:17    Procedures Procedures (including critical care time)  Medications Ordered in ED Medications  morphine 4 MG/ML injection 4 mg (4 mg Intravenous Refused 01/23/18 1130)  iopamidol (ISOVUE-370) 76 % injection (has no administration in time range)  aspirin chewable tablet 81 mg (81 mg Oral Not Given 01/23/18 1446)  enoxaparin (LOVENOX) injection 40 mg (40 mg Subcutaneous Given 01/23/18 1534)  acetaminophen (TYLENOL) tablet 650 mg (has no administration in time range)    Or  acetaminophen (TYLENOL) suppository 650 mg (has no administration in time range)  ondansetron (ZOFRAN) tablet 4 mg (has no administration in time range)    Or  ondansetron (ZOFRAN) injection 4 mg (has no administration in time range)  levalbuterol (XOPENEX) nebulizer solution 0.63 mg (has no administration in time range)  sodium chloride 0.9 % bolus 1,000 mL (0 mLs Intravenous Stopped 01/23/18 1313)  iopamidol (ISOVUE-370) 76 % injection 100 mL (100 mLs Intravenous Contrast Given 01/23/18 1344)     Initial Impression / Assessment and Plan / ED Course  I have reviewed the triage vital signs and the nursing notes.  Pertinent labs & imaging results that were available  during my care of the patient were reviewed by me and considered in my medical decision making (see chart for details).     52 y.o. M BIB EMS for evaluation of CP and SOB. Initially started at 3am. Worsened at 9am. Associated with diaphoresis, nausea/vomiting.  States he felt like his prior MI.  History of MI in 2015 a stent in place.  Was told he needed a second stent in 2018 but did not get it done.  Is a current smoker.  No history of diabetes, hypertension. Patient is afebrile, non-toxic appearing, sitting comfortably on examination table. Vital signs reviewed and stable.  Equal pulses in all 4 extremities.  Good distal cap refill.  Consider ACS etiology versus infectious etiology versus electrolyte imbalance. Low suspicion for PE or dissection but also a consideration. Plan to check basic labs.   CBC with no significant leukocytosis. No other abnormalities. Trop negative.  BMP unremarkable.   CT of chest shows no evidence of PE.  There is mention of a 14 x 6 x 5 mm area of subpleural nodularity versus atelectasis or infiltrate in the right middle lobe.  Additionally, there is evidence of a aneurysmal dilatation of the ascending thoracic aorta at 4.0 cm.  Given risk factors and presentation, patient has a HEART score of 6. Will need admission for chest pain rule out and further cardiology evaluation.   Discussed patient with hospitalist. Will admit. Recommends cardiology consult.  Discussed with Angie (Cards). Will consult on patient and evaluate in the ED.    Final Clinical Impressions(s) / ED Diagnoses   Final diagnoses:  Chest pain, unspecified type    ED Discharge Orders    None       Volanda Napoleon, PA-C 01/23/18 1545    Lacretia Leigh, MD 01/25/18 1156

## 2018-01-23 NOTE — ED Notes (Signed)
Pt states " my pain isnt that bad right now , I don't want the morphine right now , if my pain gets worse I will let you know "

## 2018-01-23 NOTE — ED Notes (Signed)
Pt denies any further pain at this time

## 2018-01-23 NOTE — Consult Note (Signed)
Cardiology Consultation:   Patient ID: Justin Justin Santana; [id number]; August 16, 1966   Admit date: 01/23/2018 Date of Consult: 01/23/2018  Primary Care Provider: Celene Squibb, MD Primary Cardiologist: [name], MD   Patient Profile:   Justin Justin Santana is a 52 y.o. male with a hx of CAD s/p MI 2015 with DES to occluded OM1 with residual severe prox/mid LAD disease, HLD who is being seen today for the evaluation of chest pain at the request of Dr. Allyson Sabal.  History of Present Illness:   Mr. Justin Justin Santana has a known history of CAD with AMI in 09/2013. Cardiac cath 10/03/2013 revealed occluded OM1 which was stented as well as severe proximal/mid LAD stenosis. He was planned for staged PCI of the LAD, however, the patient left against medical advice due to pressing social issues. In 10/2013 he myocardial perfusion study was low risk, inferior scar vs subdiaphragmatic attenuation, no anterior defect. Dr [name] discussed the case with Dr [name], and given that he was asymptomatic with negative stress test we have elected not to pursue the staged procedure and treat medically. He also had a low risk nuclear stress test in 12/2015.  [name] tells me that he has been doing well without any exertional chest discomfort or shortness of breath until this morning. 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 At about 7am he noted a tightness in his left shoulder blade area. He tried to stretch it out. Later when lifting something not very heavy he developed a more intense sharp tightness in the same location accompanied by shortness of breath and he was told that his color drained. He was also diaphoretic. His pain was 8-9/10 in intensity. The school nurse was there and 911 was called. He was given SL NTG X2 and aspirin with pain decreasing to a 5/10. After arrival to the ED his pain decreased to 3/10 where it is now continuous. He still has mild shortness of breath. He says that this is similar to his MI in  2015. His pain is reproducible with palpation. He denies any orthopnea, PND or edema.   He is a smoker, ~15 cigarettes per day since age 88. He has also been having severe right leg cramps that started about 2 months ago. He has tried taking magnesium and gatorade without improvement.    Past Medical History:  Diagnosis Date  . Blind    "born legally blind"  . Cancer ([place])    Skin cancer - melenoma per patient  . Coronary artery disease   . Hx of cardiovascular stress test    ETT/Lexiscan Myoview (10/2013):  diaph atten vs inf scar, no ischemia, EF 56%.  Marland Kitchen Hypertriglyceridemia   . Myocardial infarction ([place])   . Nystagmus     Past Surgical History:  Procedure Laterality Date  . COLONOSCOPY N/A 06/14/2017   Procedure: COLONOSCOPY;  Surgeon: [name], MD;  Location: [facility];  Service: Endoscopy;  Laterality: N/A;  830   . ELBOW SURGERY    . EYE SURGERY    . KNEE SURGERY    . LEFT HEART CATHETERIZATION WITH CORONARY ANGIOGRAM N/A 10/03/2013   Procedure: LEFT HEART CATHETERIZATION WITH CORONARY ANGIOGRAM;  Surgeon: [name], MD;  Location: [facility] CATH LAB;  Service: Cardiovascular;  Laterality: N/A;  . POLYPECTOMY  06/14/2017   Procedure: POLYPECTOMY;  Surgeon: [name], MD;  Location: [facility];  Service: Endoscopy;;  splenic flexure x5; rectal x2     Home Medications:  Prior  to Admission medications   Medication Sig Start Date End Date Taking? Authorizing Provider  aspirin EC 81 MG EC tablet Take 1 tablet (81 mg total) by mouth daily. 10/04/13  Yes Simmons, Brittainy M, PA-C  losartan (COZAAR) 25 MG tablet TAKE 1/2 TABLET (12.5 MG TOTAL) BY MOUTH DAILY. (PLEASE CUT IN HALF FOR PT) 05/06/17  Yes Branch, Alphonse Guild, MD  polyethylene glycol-electrolytes (TRILYTE) 420 g solution Take 4,000 mLs by mouth as directed. Patient not taking: Reported on 01/23/2018 05/13/17   [name], MD    Inpatient Medications: Scheduled Meds: . aspirin  81 mg Oral Daily  .  enoxaparin (LOVENOX) injection  40 mg Subcutaneous Q24H  . iopamidol      .  morphine injection  4 mg Intravenous Once   Continuous Infusions:  PRN Meds: acetaminophen **OR** acetaminophen, levalbuterol, ondansetron **OR** ondansetron (ZOFRAN) IV  Allergies:    Allergies  Allergen Reactions  . Aleve [Naproxen Sodium] Shortness Of Breath and Other (See Comments)    Sweating, difficulty breathing  . Iodine Other (See Comments)    Blistering of skin    Social History:   Social History   Socioeconomic History  . Marital status: Divorced    Spouse name: Not on file  . Number of children: 1  . Years of education: Not on file  . Highest education level: Not on file  Occupational History  . Not on file  Social Needs  . Financial resource strain: Not on file  . Food insecurity:    Worry: Not on file    Inability: Not on file  . Transportation needs:    Medical: Not on file    Non-medical: Not on file  Tobacco Use  . Smoking status: Current Every Day Smoker    Packs/day: 0.25    Years: 37.00    Pack years: 9.25    Types: Cigarettes    Start date: 09/29/1976  . Smokeless tobacco: Never Used  . Tobacco comment: 15 ciggs per day  Substance and Sexual Activity  . Alcohol use: No    Alcohol/week: 0.0 oz  . Drug use: No  . Sexual activity: Not on file  Lifestyle  . Physical activity:    Days per week: Not on file    Minutes per session: Not on file  . Stress: Not on file  Relationships  . Social connections:    Talks on phone: Not on file    Gets together: Not on file    Attends religious service: Not on file    Active member of club or organization: Not on file    Attends meetings of clubs or organizations: Not on file    Relationship status: Not on file  . Intimate partner violence:    Fear of current or ex partner: Not on file    Emotionally abused: Not on file    Physically abused: Not on file    Forced sexual activity: Not on file  Other Topics Concern  . Not  on file  Social History Narrative   Lives with daughter.      Family History:    Family History  Problem Relation Age of Onset  . Heart disease Father        Pacemaker  . Colon cancer Neg Hx      ROS:  Please see the history of present illness.   All other ROS reviewed and negative.     Physical Exam/Data:   Vitals:   01/23/18 1415 01/23/18  1430 01/23/18 1445 01/23/18 1530  BP: 111/70 104/83 130/74 117/73  Pulse: 66 72 70 66  Resp: (!) 21 20 (!) 23 18  Temp:      TempSrc:      SpO2: 95% 95% 94% 95%  Weight:      Height:        Intake/Output Summary (Last 24 hours) at 01/23/2018 1609 Last data filed at 01/23/2018 1313 Gross per 24 hour  Intake 1000 ml  Output -  Net 1000 ml   Filed Weights   01/23/18 1034  Weight: 200 lb (90.7 kg)   Body mass index is 28.7 kg/m.  General:  Well nourished, well developed, in no acute distress HEENT: normal Lymph: no adenopathy Neck: no JVD Endocrine:  No thryomegaly Vascular: No carotid bruits; FA pulses 2+ bilaterally without bruits  Cardiac:  normal S1, S2; RRR; no murmur  Lungs:  clear to auscultation bilaterally, no wheezing, rhonchi or rales  Abd: soft, nontender, no hepatomegaly  Ext: no edema Musculoskeletal:  No deformities, BUE and BLE strength normal and equal Skin: warm and dry  Neuro:  CNs 2-12 intact, no focal abnormalities noted Psych:  Normal affect   EKG:  The EKG was personally reviewed and demonstrates:  Sinus rhythm 78 bpm, no acute ischemic changes Telemetry:  Telemetry was personally reviewed and demonstrates:  Sinus rhythm 58-low 60's  Relevant CV Studies:  Myocardial perfusion imaging 01/02/16  There was no ST segment deviation noted during stress.  The study is normal.  This is a low risk study. There are no perfusion defects consistent with prior infarct or current ischemia.  The left ventricular ejection fraction is mildly decreased (45-54%).   Left heart cath 10/03/13 Final Conclusions:     1. Total occlusion of the OM1 treated successfully with primary PCI (DES platform) 2. Severe proximal/mid LAD stenosis  3. Patent, dominant RCA with minor nonobstructive disease 4. Mild segmental LV dysfunction  The proximal LAD has diffuse 50% stenosis leading into an 80% mid-stenosis. The mid and distal LAD are patent. The first diagonal is small and there is a 90% stenosis in the proximal vessel.   Recommendations:  Post-MI medical therapy, Staged PCI of the LAD prior to discharge.  Laboratory Data:  Chemistry Recent Labs  Lab 01/23/18 1035  NA 139  K 4.9  CL 106  CO2 25  GLUCOSE 90  BUN 17  CREATININE 1.12  CALCIUM 8.8*  GFRNONAA >60  GFRAA >60  ANIONGAP 8    No results for input(s): PROT, ALBUMIN, AST, ALT, ALKPHOS, BILITOT in the last 168 hours. Hematology Recent Labs  Lab 01/23/18 1035  WBC 10.0  RBC 4.97  HGB 15.2  HCT 44.7  MCV 89.9  MCH 30.6  MCHC 34.0  RDW 12.6  PLT 147*   Cardiac EnzymesNo results for input(s): TROPONINI in the last 168 hours.  Recent Labs  Lab 01/23/18 1053  TROPIPOC 0.00    BNPNo results for input(s): BNP, PROBNP in the last 168 hours.  DDimer No results for input(s): DDIMER in the last 168 hours.  Radiology/Studies:  Dg Chest 2 View  Result Date: 01/23/2018 CLINICAL DATA:  Chest and left shoulder pain since this morning with shortness of breath. Smoker. EXAM: CHEST - 2 VIEW COMPARISON:  11/28/2017. FINDINGS: Normal sized heart. Clear lungs. Stable diffusely prominent interstitial markings. Normal vascularity. Minimal thoracolumbar spine degenerative changes. IMPRESSION: No acute abnormality. Stable chronic interstitial lung disease compatible with the history of smoking. Electronically Signed   By:  Claudie Revering M.D.   On: 01/23/2018 11:23   Ct Angio Chest Pe W And/or Wo Contrast  Result Date: 01/23/2018 CLINICAL DATA:  Shortness of breath and back pain since 0300 hours, history coronary disease post MI, hypertension, smoker  EXAM: CT ANGIOGRAPHY CHEST WITH CONTRAST TECHNIQUE: Multidetector CT imaging of the chest was performed using the standard protocol during bolus administration of intravenous contrast. Multiplanar CT image reconstructions and MIPs were obtained to evaluate the vascular anatomy. CONTRAST:  14m ISOVUE-370 IOPAMIDOL (ISOVUE-370) INJECTION 76% IV COMPARISON:  None FINDINGS: Cardiovascular: Mild aneurysmal dilatation of the ascending thoracic aorta 4.0 cm diameter. No aortic dissection. Coronary arterial calcifications present. No pericardial effusion. Excellent opacification of the pulmonary arteries. No pulmonary arterial filling defects identified to suggest pulmonary embolism. Mediastinum/Nodes: Esophagus unremarkable. Base of cervical region normal appearance. Few normal sized mediastinal and RIGHT hilar lymph nodes. Without thoracic adenopathy. Lungs/Pleura: Central peribronchial thickening. Peripheral area of nodularity versus atelectasis or infiltrate RIGHT middle lobe 14 x 6 x 5103mdiameter image 51. Minimal atelectasis RIGHT base. Remaining lungs clear. No additional pulmonary infiltrate, pleural effusion, or pneumothorax. Upper Abdomen: Visualized upper abdomen unremarkable. Musculoskeletal: No acute osseous findings. Review of the MIP images confirms the above findings. IMPRESSION: No evidence of pulmonary embolism. 14 x 6 x 5 mm area of subpleural nodularity versus atelectasis or infiltrate in RIGHT middle lobe adjacent to minor fissure; short-term follow-up CT chest recommended in 4 weeks to assess for persistence. Atherosclerotic changes including coronary arterial calcification and aneurysmal dilatation of the ascending thoracic aorta 4.0 cm greatest size; recommendation below. Recommend annual imaging followup by CTA or MRA. This recommendation follows 2010 ACCF/AHA/AATS/ACR/ASA/SCA/SCAI/SIR/STS/SVM Guidelines for the Diagnosis and Management of Patients with Thoracic Aortic Disease. Circulation. 2010;  121: [id number] Atherosclerosis (ICD10-I70.0). Aortic aneurysm NOS (ICD10-I71.9). Electronically Signed   By: MaLavonia Dana.D.   On: 01/23/2018 14:17    Assessment and Plan:   Chest pain -Pt with known CAD, s/p AMI in 09/2013 with DES to occluded OM1 with residual severe prox/mid LAD disease. It was planned to PCI the LAD but this was not done. Subsequent low risk myoview in 10/2013 and 12/2015. -Pt has been treated with aspirin and ARB. No statin due to intolerance to atorvastatin. Likely not on BB due to HR around 60. -Presented for pain in the left shoulder blade area which caused shortness of breath and diaphoresis. This is reproducible with palpation and continuous. -Troponins negative X2 -No ischemic changes on EKG -CXR shows No acute abnormality, Stable chronic interstitial lung disease compatible with the history of smoking -Negative for PE on CT. Coronary artery calcifications noted. -His pain is atypical for myocardial ischemia.  -Advise to continue to cycle enzymes. -Will check echo and lexiscan myoview tomorrow.   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 Recommend annual imaging.  Pulmonary nodule -14 x 6 x 5 mm area of subpleural nodularity versus atelectasis or infiltrate in RIGHT middle lobe adjacent to minor fissure; short-term follow-up CT chest recommended in 4 weeks to assess for persistence.  Tobacco abuse -Pt smokes ~15 cigarettes per day since age [age]Advised on effects of smoking on vascular health and risk of MI, stroke. -He says he is trying to quit.   Hyperlipemia -Pt reports inability to tolerate multiple statins due to myalgias, arthralgias and fatigue. -LDL 164 in 2015, 132 in 01/2018 -With his known CAD and LDL goal of <70 not met, he should be referred to our lipid clinic for initiation of PCSK9  inhibitor.   For questions or updates, please contact [place] Please consult [email or web address] for contact info under Cardiology/STEMI.     Signed, Daune Perch, NP  01/23/2018 4:09 PM

## 2018-01-23 NOTE — ED Notes (Signed)
Patient transported to X-ray 

## 2018-01-23 NOTE — H&P (Signed)
[facility] History and Physical  Justin Santana XLK:[id number] DOB: 1966-01-02 DOA: 01/23/2018  Referring physician:   PCP: Celene Squibb, MD   Chief Complaint:  Chest pain  HPI:  52 year old male with a history of acute myocardial infarction in the past in 2015, hypertension, smoker,previous cardiac cath revealing total occlusion of total occlusion of the OM1. There was also severe proximal/mid LAD stenosis. Patent, dominant RCA with minor nonobstructive disease. Mild segmental LV dysfunction was noted with an EF of 55%. He underwent successful PCI utilizing a DES to the totally occluded OM1. Staged PCI of the LAD prior to discharge was recommended. Post cath today patient was very anxious and left AMA without addressing his LAD lesion. Subsequently has seen Dr. [name] in [place]. He was advised to undergo another cardiac catheterization 8 months ago . Patient presents today with acute onset of left shoulder pain brought on by lifting a heavy weight, lasting greater than 15 minutes. When he called EMS EMS found him to be pale and diaphoretic and administered 4 baby aspirin 6 and 2 sublingual nitros   He denies any prior episodes of chest pain similar to his presentation today ED course Blood pressure 130/74 pulse of 59, temperature 97.6, Creatinine 1.12, troponin negative, white blood cell count 10.0, heme hematocrit 15.2  His EKG and his troponin unremarkable CT of the chest shows subpleural nodularity versus atelectasis or infiltrate in the right middle lobe adjacent to the minor fissure, repeat CT chest in 4 weeks recommended Found to have 4 cm aneurysmal dilatation in the ascending thoracic aorta as well as coronary arterial calcification Patient admitted for further evaluation of this chest pain with a heart score of 6 Cardiology has been consulted     Review of Systems: negative for the following       Past Medical History:  Diagnosis Date  . Blind    "born legally blind"  .  Cancer ([place])    Skin cancer - melenoma per patient  . Coronary artery disease   . Hx of cardiovascular stress test    ETT/Lexiscan Myoview (10/2013):  diaph atten vs inf scar, no ischemia, EF 56%.  Marland Kitchen Hypertriglyceridemia   . Myocardial infarction ([place])   . Nystagmus      Past Surgical History:  Procedure Laterality Date  . COLONOSCOPY N/A 06/14/2017   Procedure: COLONOSCOPY;  Surgeon: [name], MD;  Location: [facility];  Service: Endoscopy;  Laterality: N/A;  830   . ELBOW SURGERY    . EYE SURGERY    . KNEE SURGERY    . LEFT HEART CATHETERIZATION WITH CORONARY ANGIOGRAM N/A 10/03/2013   Procedure: LEFT HEART CATHETERIZATION WITH CORONARY ANGIOGRAM;  Surgeon: [name], MD;  Location: [facility] CATH LAB;  Service: Cardiovascular;  Laterality: N/A;  . POLYPECTOMY  06/14/2017   Procedure: POLYPECTOMY;  Surgeon: [name], MD;  Location: [facility];  Service: Endoscopy;;  splenic flexure x5; rectal x2      Social History:  reports that he has been smoking cigarettes.  He started smoking about 41 years ago. He has a 9.25 pack-year smoking history. He has never used smokeless tobacco. He reports that he does not drink alcohol or use drugs.    Allergies  Allergen Reactions  . Aleve [Naproxen Sodium] Shortness Of Breath and Other (See Comments)    Sweating, difficulty breathing  . Iodine Other (See Comments)    Blistering of skin    Family History  Problem Relation Age of Onset  .  Heart disease Father        Pacemaker  . Colon cancer Neg Hx         Prior to Admission medications   Medication Sig Start Date End Date Taking? Authorizing Provider  aspirin EC 81 MG EC tablet Take 1 tablet (81 mg total) by mouth daily. 10/04/13   Lyda Jester M, PA-C  losartan (COZAAR) 25 MG tablet TAKE 1/2 TABLET (12.5 MG TOTAL) BY MOUTH DAILY. (PLEASE CUT IN HALF FOR PT) 05/06/17   Arnoldo Lenis, MD  polyethylene glycol-electrolytes (TRILYTE) 420 g solution Take 4,000  mLs by mouth as directed. 05/13/17   [name], MD     Physical Exam: Vitals:   01/23/18 1400 01/23/18 1415 01/23/18 1430 01/23/18 1445  BP: 110/73 111/70 104/83 130/74  Pulse: 66 66 72 70  Resp: (!) 24 (!) 21 20 (!) 23  Temp:      TempSrc:      SpO2: 95% 95% 95% 94%  Weight:      Height:            Vitals:   01/23/18 1400 01/23/18 1415 01/23/18 1430 01/23/18 1445  BP: 110/73 111/70 104/83 130/74  Pulse: 66 66 72 70  Resp: (!) 24 (!) 21 20 (!) 23  Temp:      TempSrc:      SpO2: 95% 95% 95% 94%  Weight:      Height:       Constitutional: NAD, calm, comfortable Eyes: PERRL, lids and conjunctivae normal ENMT: Mucous membranes are moist. Posterior pharynx clear of any exudate or lesions.Normal dentition.  Neck: normal, supple, no masses, no thyromegaly Respiratory: clear to auscultation bilaterally, no wheezing, no crackles. Normal respiratory effort. No accessory muscle use.  Cardiovascular: Regular rate and rhythm, no murmurs / rubs / gallops. No extremity edema. 2+ pedal pulses. No carotid bruits.  Abdomen: no tenderness, no masses palpated. No hepatosplenomegaly. Bowel sounds positive.  Musculoskeletal: no clubbing / cyanosis. No joint deformity upper and lower extremities. Good ROM, no contractures. Normal muscle tone.  Skin: no rashes, lesions, ulcers. No induration Neurologic: CN 2-12 grossly intact. Sensation intact, DTR normal. Strength 5/5 in all 4.  Psychiatric: Normal judgment and insight. Alert and oriented x 3. Normal mood.     Labs on Admission: I have personally reviewed following labs and imaging studies  CBC: Recent Labs  Lab 01/23/18 1035  WBC 10.0  NEUTROABS 6.7  HGB 15.2  HCT 44.7  MCV 89.9  PLT 147*    Basic Metabolic Panel: Recent Labs  Lab 01/23/18 1035  NA 139  K 4.9  CL 106  CO2 25  GLUCOSE 90  BUN 17  CREATININE 1.12  CALCIUM 8.8*    GFR: Estimated Creatinine Clearance: 85.4 mL/min (by C-G formula based on SCr of  1.12 mg/dL).  Liver Function Tests: No results for input(s): AST, ALT, ALKPHOS, BILITOT, PROT, ALBUMIN in the last 168 hours. No results for input(s): LIPASE, AMYLASE in the last 168 hours. No results for input(s): AMMONIA in the last 168 hours.  Coagulation Profile: No results for input(s): INR, PROTIME in the last 168 hours. No results for input(s): DDIMER in the last 72 hours.  Cardiac Enzymes: No results for input(s): CKTOTAL, CKMB, CKMBINDEX, TROPONINI in the last 168 hours.  BNP (last 3 results) No results for input(s): PROBNP in the last 8760 hours.  HbA1C: No results for input(s): HGBA1C in the last 72 hours. No results found for: HGBA1C   CBG:  No results for input(s): GLUCAP in the last 168 hours.  Lipid Profile: No results for input(s): CHOL, HDL, LDLCALC, TRIG, CHOLHDL, LDLDIRECT in the last 72 hours.  Thyroid Function Tests: No results for input(s): TSH, T4TOTAL, FREET4, T3FREE, THYROIDAB in the last 72 hours.  Anemia Panel: No results for input(s): VITAMINB12, FOLATE, FERRITIN, TIBC, IRON, RETICCTPCT in the last 72 hours.  Urine analysis: No results found for: COLORURINE, APPEARANCEUR, LABSPEC, PHURINE, GLUCOSEU, HGBUR, BILIRUBINUR, KETONESUR, PROTEINUR, UROBILINOGEN, NITRITE, LEUKOCYTESUR  Sepsis Labs: @LABRCNTIP (procalcitonin:4,lacticidven:4) )No results found for this or any previous visit (from the past 240 hour(s)).       Radiological Exams on Admission: Dg Chest 2 View  Result Date: 01/23/2018 CLINICAL DATA:  Chest and left shoulder pain since this morning with shortness of breath. Smoker. EXAM: CHEST - 2 VIEW COMPARISON:  11/28/2017. FINDINGS: Normal sized heart. Clear lungs. Stable diffusely prominent interstitial markings. Normal vascularity. Minimal thoracolumbar spine degenerative changes. IMPRESSION: No acute abnormality. Stable chronic interstitial lung disease compatible with the history of smoking. Electronically Signed   By: Claudie Revering  M.D.   On: 01/23/2018 11:23   Ct Angio Chest Pe W And/or Wo Contrast  Result Date: 01/23/2018 CLINICAL DATA:  Shortness of breath and back pain since 0300 hours, history coronary disease post MI, hypertension, smoker EXAM: CT ANGIOGRAPHY CHEST WITH CONTRAST TECHNIQUE: Multidetector CT imaging of the chest was performed using the standard protocol during bolus administration of intravenous contrast. Multiplanar CT image reconstructions and MIPs were obtained to evaluate the vascular anatomy. CONTRAST:  185mL ISOVUE-370 IOPAMIDOL (ISOVUE-370) INJECTION 76% IV COMPARISON:  None FINDINGS: Cardiovascular: Mild aneurysmal dilatation of the ascending thoracic aorta 4.0 cm diameter. No aortic dissection. Coronary arterial calcifications present. No pericardial effusion. Excellent opacification of the pulmonary arteries. No pulmonary arterial filling defects identified to suggest pulmonary embolism. Mediastinum/Nodes: Esophagus unremarkable. Base of cervical region normal appearance. Few normal sized mediastinal and RIGHT hilar lymph nodes. Without thoracic adenopathy. Lungs/Pleura: Central peribronchial thickening. Peripheral area of nodularity versus atelectasis or infiltrate RIGHT middle lobe 14 x 6 x 89mm diameter image 51. Minimal atelectasis RIGHT base. Remaining lungs clear. No additional pulmonary infiltrate, pleural effusion, or pneumothorax. Upper Abdomen: Visualized upper abdomen unremarkable. Musculoskeletal: No acute osseous findings. Review of the MIP images confirms the above findings. IMPRESSION: No evidence of pulmonary embolism. 14 x 6 x 5 mm area of subpleural nodularity versus atelectasis or infiltrate in RIGHT middle lobe adjacent to minor fissure; short-term follow-up CT chest recommended in 4 weeks to assess for persistence. Atherosclerotic changes including coronary arterial calcification and aneurysmal dilatation of the ascending thoracic aorta 4.0 cm greatest size; recommendation below. Recommend  annual imaging followup by CTA or MRA. This recommendation follows 2010 ACCF/AHA/AATS/ACR/ASA/SCA/SCAI/SIR/STS/SVM Guidelines for the Diagnosis and Management of Patients with Thoracic Aortic Disease. Circulation. 2010; 121: K025-K270 Aortic Atherosclerosis (ICD10-I70.0). Aortic aneurysm NOS (ICD10-I71.9). Electronically Signed   By: Lavonia Dana M.D.   On: 01/23/2018 14:17   Dg Chest 2 View  Result Date: 01/23/2018 CLINICAL DATA:  Chest and left shoulder pain since this morning with shortness of breath. Smoker. EXAM: CHEST - 2 VIEW COMPARISON:  11/28/2017. FINDINGS: Normal sized heart. Clear lungs. Stable diffusely prominent interstitial markings. Normal vascularity. Minimal thoracolumbar spine degenerative changes. IMPRESSION: No acute abnormality. Stable chronic interstitial lung disease compatible with the history of smoking. Electronically Signed   By: Claudie Revering M.D.   On: 01/23/2018 11:23   Ct Angio Chest Pe W And/or Wo Contrast  Result Date: 01/23/2018 CLINICAL DATA:  Shortness of breath and back pain since 0300 hours, history coronary disease post MI, hypertension, smoker EXAM: CT ANGIOGRAPHY CHEST WITH CONTRAST TECHNIQUE: Multidetector CT imaging of the chest was performed using the standard protocol during bolus administration of intravenous contrast. Multiplanar CT image reconstructions and MIPs were obtained to evaluate the vascular anatomy. CONTRAST:  198mL ISOVUE-370 IOPAMIDOL (ISOVUE-370) INJECTION 76% IV COMPARISON:  None FINDINGS: Cardiovascular: Mild aneurysmal dilatation of the ascending thoracic aorta 4.0 cm diameter. No aortic dissection. Coronary arterial calcifications present. No pericardial effusion. Excellent opacification of the pulmonary arteries. No pulmonary arterial filling defects identified to suggest pulmonary embolism. Mediastinum/Nodes: Esophagus unremarkable. Base of cervical region normal appearance. Few normal sized mediastinal and RIGHT hilar lymph nodes. Without  thoracic adenopathy. Lungs/Pleura: Central peribronchial thickening. Peripheral area of nodularity versus atelectasis or infiltrate RIGHT middle lobe 14 x 6 x 33mm diameter image 51. Minimal atelectasis RIGHT base. Remaining lungs clear. No additional pulmonary infiltrate, pleural effusion, or pneumothorax. Upper Abdomen: Visualized upper abdomen unremarkable. Musculoskeletal: No acute osseous findings. Review of the MIP images confirms the above findings. IMPRESSION: No evidence of pulmonary embolism. 14 x 6 x 5 mm area of subpleural nodularity versus atelectasis or infiltrate in RIGHT middle lobe adjacent to minor fissure; short-term follow-up CT chest recommended in 4 weeks to assess for persistence. Atherosclerotic changes including coronary arterial calcification and aneurysmal dilatation of the ascending thoracic aorta 4.0 cm greatest size; recommendation below. Recommend annual imaging followup by CTA or MRA. This recommendation follows 2010 ACCF/AHA/AATS/ACR/ASA/SCA/SCAI/SIR/STS/SVM Guidelines for the Diagnosis and Management of Patients with Thoracic Aortic Disease. Circulation. 2010; 121: I786-V672 Aortic Atherosclerosis (ICD10-I70.0). Aortic aneurysm NOS (ICD10-I71.9). Electronically Signed   By: Lavonia Dana M.D.   On: 01/23/2018 14:17      EKG: Independently reviewed.  Normal sinus rhythm  Assessment/Plan Principal Problem:   Unstable angina [facility]) Patient has a known history of an LAD lesion that needed to be addressed 4 years ago Heart score is 6 Cardiology has been notified Continue aspirin, no beta blockers the patient is slightly bradycardic Check hemoglobin A1c and lipid panel May benefit from a statin Nothing by mouth until decision is made regarding intervention for cardiac catheterization Will defer initiation of heparin drip to cardiology CTA negative for PE , 2-D echo ordered to rule out wall motion abnormalities as well as assessing his EF  Ascending aortic aneurysm   Measuring 4.0 cm Recommend periodic monitoring every 6 months by cardiothoracic surgery   Tobacco abuse-smoking cessation counseling has been done    DVT prophylaxis:   lovenox     Code Status Orders full code   (From admission, onward)          consults called:cardiology  Family Communication: Admission, patients condition and plan of care including tests being ordered have been discussed with the patient  who indicates understanding and agree with the plan and Code Status   Admission status:  Observation   Disposition plan: Further plan will depend as patient's clinical course evolves and further radiologic and laboratory data become available. Likely home when stable     Reyne Dumas MD [facility] Pager [phone]  If 7PM-7AM, please contact night-coverage [email or web address] Password [email or web address]  01/23/2018, 2:53 PM

## 2018-01-23 NOTE — ED Notes (Signed)
Pt going to ct scan at this time  

## 2018-01-24 ENCOUNTER — Observation Stay (HOSPITAL_BASED_OUTPATIENT_CLINIC_OR_DEPARTMENT_OTHER): Payer: BC Managed Care – PPO

## 2018-01-24 DIAGNOSIS — I200 Unstable angina: Secondary | ICD-10-CM | POA: Diagnosis not present

## 2018-01-24 DIAGNOSIS — I712 Thoracic aortic aneurysm, without rupture: Secondary | ICD-10-CM | POA: Diagnosis not present

## 2018-01-24 DIAGNOSIS — I351 Nonrheumatic aortic (valve) insufficiency: Secondary | ICD-10-CM | POA: Diagnosis not present

## 2018-01-24 DIAGNOSIS — I2510 Atherosclerotic heart disease of native coronary artery without angina pectoris: Secondary | ICD-10-CM | POA: Diagnosis not present

## 2018-01-24 DIAGNOSIS — Z720 Tobacco use: Secondary | ICD-10-CM | POA: Diagnosis not present

## 2018-01-24 DIAGNOSIS — R079 Chest pain, unspecified: Secondary | ICD-10-CM

## 2018-01-24 DIAGNOSIS — E7800 Pure hypercholesterolemia, unspecified: Secondary | ICD-10-CM | POA: Diagnosis not present

## 2018-01-24 LAB — CBC
HCT: 45 % (ref 39.0–52.0)
Hemoglobin: 14.9 g/dL (ref 13.0–17.0)
MCH: 30.5 pg (ref 26.0–34.0)
MCHC: 33.1 g/dL (ref 30.0–36.0)
MCV: 92.2 fL (ref 78.0–100.0)
Platelets: 114 10*3/uL — ABNORMAL LOW (ref 150–400)
RBC: 4.88 MIL/uL (ref 4.22–5.81)
RDW: 12.7 % (ref 11.5–15.5)
WBC: 7.3 10*3/uL (ref 4.0–10.5)

## 2018-01-24 LAB — COMPREHENSIVE METABOLIC PANEL
ALT: 18 U/L (ref 17–63)
AST: 18 U/L (ref 15–41)
Albumin: 3.3 g/dL — ABNORMAL LOW (ref 3.5–5.0)
Alkaline Phosphatase: 63 U/L (ref 38–126)
Anion gap: 5 (ref 5–15)
BUN: 15 mg/dL (ref 6–20)
CO2: 24 mmol/L (ref 22–32)
Calcium: 8.4 mg/dL — ABNORMAL LOW (ref 8.9–10.3)
Chloride: 109 mmol/L (ref 101–111)
Creatinine, Ser: 1.11 mg/dL (ref 0.61–1.24)
GFR calc Af Amer: >60 mL/min (ref >60)
GFR calc non Af Amer: >60 mL/min (ref >60)
Glucose, Bld: 105 mg/dL — ABNORMAL HIGH (ref 65–99)
Potassium: 4.5 mmol/L (ref 3.5–5.1)
Sodium: 138 mmol/L (ref 135–145)
Total Bilirubin: 0.9 mg/dL (ref 0.3–1.2)
Total Protein: 5.8 g/dL — ABNORMAL LOW (ref 6.5–8.1)

## 2018-01-24 LAB — NM MYOCAR MULTI W/SPECT W/WALL MOTION / EF
Estimated workload: 1 METS
Exercise duration (min): 5 min
Exercise duration (sec): 19 s
MPHR: 166 {beats}/min
Peak HR: 100 {beats}/min
Percent HR: 60 %
Rest HR: 61 {beats}/min

## 2018-01-24 LAB — ECHOCARDIOGRAM COMPLETE
Height: 70 in
Weight: 3200 oz

## 2018-01-24 LAB — HIV ANTIBODY (ROUTINE TESTING W REFLEX): HIV Screen 4th Generation wRfx: NONREACTIVE

## 2018-01-24 LAB — TROPONIN I: Troponin I: <0.03 ng/mL (ref <0.03)

## 2018-01-24 MED ORDER — REGADENOSON 0.4 MG/5ML IV SOLN
0.4000 mg | Freq: Once | INTRAVENOUS | Status: AC
  Administered 2018-01-24: 0.4 mg via INTRAVENOUS
  Filled 2018-01-24: qty 5

## 2018-01-24 MED ORDER — LOSARTAN POTASSIUM 25 MG PO TABS: 12.5000 mg | ORAL_TABLET | Freq: Every day | ORAL | 3 refills | Status: DC

## 2018-01-24 MED ORDER — TECHNETIUM TC 99M TETROFOSMIN IV KIT
30.0000 | PACK | Freq: Once | INTRAVENOUS | Status: AC | PRN
  Administered 2018-01-24: 30 via INTRAVENOUS

## 2018-01-24 MED ORDER — REGADENOSON 0.4 MG/5ML IV SOLN
INTRAVENOUS | Status: AC
  Administered 2018-01-24: 0.4 mg via INTRAVENOUS
  Filled 2018-01-24: qty 5

## 2018-01-24 MED ORDER — PERFLUTREN LIPID MICROSPHERE
1.0000 mL | INTRAVENOUS | Status: AC | PRN
  Administered 2018-01-24: 2 mL via INTRAVENOUS
  Filled 2018-01-24: qty 10

## 2018-01-24 MED ORDER — TECHNETIUM TC 99M TETROFOSMIN IV KIT
10.0000 | PACK | Freq: Once | INTRAVENOUS | Status: AC | PRN
  Administered 2018-01-24: 10 via INTRAVENOUS

## 2018-01-24 NOTE — Progress Notes (Addendum)
Progress Note  Patient Name: Justin Santana Date of Encounter: 01/24/2018  Primary Cardiologist: [name]  Subjective   Pt feeling well today. Pt seen in nuclear medicine while completing stress test. No complatints   Inpatient Medications    Scheduled Meds: . aspirin  81 mg Oral Daily  . enoxaparin (LOVENOX) injection  40 mg Subcutaneous Q24H  .  morphine injection  4 mg Intravenous Once   Continuous Infusions:  PRN Meds: acetaminophen **OR** acetaminophen, levalbuterol, ondansetron **OR** ondansetron (ZOFRAN) IV   Vital Signs    Vitals:   01/23/18 1600 01/23/18 1628 01/23/18 1958 01/24/18 0616  BP: 117/76 133/76 112/64 120/75  Pulse: 61 69 67 62  Resp: (!) 26  18 18   Temp:  97.8 F (36.6 C) 98.3 F (36.8 C) 97.7 F (36.5 C)  TempSrc:  Oral Oral Oral  SpO2: 94% 96% 94% 95%  Weight:      Height:        Intake/Output Summary (Last 24 hours) at 01/24/2018 0756 Last data filed at 01/23/2018 2250 Gross per 24 hour  Intake 1240 ml  Output -  Net 1240 ml   Filed Weights   01/23/18 1034  Weight: 200 lb (90.7 kg)    Telemetry    01/24/18 NSR 87- Personally Reviewed  ECG    01/24/18 NSR 86 - Personally Reviewed  Physical Exam   GEN: No acute distress.  HEENT: Normocephalic, atraumatic, sclera non-icteric. Neck: No JVD or bruits. Cardiac: RRR no murmurs, rubs, or gallops.  Radials/DP/PT 1+ and equal bilaterally.  Respiratory: Clear to auscultation bilaterally. Breathing is unlabored. GI: Soft, nontender, non-distended, BS +x 4. MS: no deformity. Extremities: No clubbing or cyanosis. No edema. Distal pedal pulses are 2+ and equal bilaterally. Neuro:  AAOx3. Follows commands. Psych:  Responds to questions appropriately with a normal affect.  Labs    Chemistry Recent Labs  Lab 01/23/18 1035 01/24/18 0401  NA 139 138  K 4.9 4.5  CL 106 109  CO2 25 24  GLUCOSE 90 105*  BUN 17 15  CREATININE 1.12 1.11  CALCIUM 8.8* 8.4*  PROT  --  5.8*  ALBUMIN   --  3.3*  AST  --  18  ALT  --  18  ALKPHOS  --  63  BILITOT  --  0.9  GFRNONAA >60 >60  GFRAA >60 >60  ANIONGAP 8 5     Hematology Recent Labs  Lab 01/23/18 1035 01/24/18 0401  WBC 10.0 7.3  RBC 4.97 4.88  HGB 15.2 14.9  HCT 44.7 45.0  MCV 89.9 92.2  MCH 30.6 30.5  MCHC 34.0 33.1  RDW 12.6 12.7  PLT 147* 114*    Cardiac Enzymes Recent Labs  Lab 01/23/18 1449 01/23/18 2056 01/24/18 0401  TROPONINI <0.03 <0.03 <0.03    Recent Labs  Lab 01/23/18 1053  TROPIPOC 0.00     BNPNo results for input(s): BNP, PROBNP in the last 168 hours.   DDimer No results for input(s): DDIMER in the last 168 hours.   Radiology    Dg Chest 2 View  Result Date: 01/23/2018 CLINICAL DATA:  Chest and left shoulder pain since this morning with shortness of breath. Smoker. EXAM: CHEST - 2 VIEW COMPARISON:  11/28/2017. FINDINGS: Normal sized heart. Clear lungs. Stable diffusely prominent interstitial markings. Normal vascularity. Minimal thoracolumbar spine degenerative changes. IMPRESSION: No acute abnormality. Stable chronic interstitial lung disease compatible with the history of smoking. Electronically Signed   By: Percell Locus.D.  On: 01/23/2018 11:23   Ct Angio Chest Pe W And/or Wo Contrast  Result Date: 01/23/2018 CLINICAL DATA:  Shortness of breath and back pain since 0300 hours, history coronary disease post MI, hypertension, smoker EXAM: CT ANGIOGRAPHY CHEST WITH CONTRAST TECHNIQUE: Multidetector CT imaging of the chest was performed using the standard protocol during bolus administration of intravenous contrast. Multiplanar CT image reconstructions and MIPs were obtained to evaluate the vascular anatomy. CONTRAST:  153m ISOVUE-370 IOPAMIDOL (ISOVUE-370) INJECTION 76% IV COMPARISON:  None FINDINGS: Cardiovascular: Mild aneurysmal dilatation of the ascending thoracic aorta 4.0 cm diameter. No aortic dissection. Coronary arterial calcifications present. No pericardial effusion.  Excellent opacification of the pulmonary arteries. No pulmonary arterial filling defects identified to suggest pulmonary embolism. Mediastinum/Nodes: Esophagus unremarkable. Base of cervical region normal appearance. Few normal sized mediastinal and RIGHT hilar lymph nodes. Without thoracic adenopathy. Lungs/Pleura: Central peribronchial thickening. Peripheral area of nodularity versus atelectasis or infiltrate RIGHT middle lobe 14 x 6 x 548mdiameter image 51. Minimal atelectasis RIGHT base. Remaining lungs clear. No additional pulmonary infiltrate, pleural effusion, or pneumothorax. Upper Abdomen: Visualized upper abdomen unremarkable. Musculoskeletal: No acute osseous findings. Review of the MIP images confirms the above findings. IMPRESSION: No evidence of pulmonary embolism. 14 x 6 x 5 mm area of subpleural nodularity versus atelectasis or infiltrate in RIGHT middle lobe adjacent to minor fissure; short-term follow-up CT chest recommended in 4 weeks to assess for persistence. Atherosclerotic changes including coronary arterial calcification and aneurysmal dilatation of the ascending thoracic aorta 4.0 cm greatest size; recommendation below. Recommend annual imaging followup by CTA or MRA. This recommendation follows 2010 ACCF/AHA/AATS/ACR/ASA/SCA/SCAI/SIR/STS/SVM Guidelines for the Diagnosis and Management of Patients with Thoracic Aortic Disease. Circulation. 2010; 121: [id number] Atherosclerosis (ICD10-I70.0). Aortic aneurysm NOS (ICD10-I71.9). Electronically Signed   By: MaLavonia Dana.D.   On: 01/23/2018 14:17   Patient Profile     [age].o. male with  CAD s/p MI 2015 with DES to occluded OM1 with residual severe prox/mid LAD disease, HLD, ongoing tobacco abuse, born legally blind admitted with chest pain and negative troponins.  Assessment & Plan    1. Chest pain - r/o for MI. For nuc today. Losartan on hold. Consider addition of antianginal therapy - on very little med prior to  admission. -Lexiscan stress completed 01/24/18 with pending results.   2. Ascending thoracic aorta dilitation -- -4.0 cm on CT. Recommend annual imaging as OP.  3. Pulmonary nodule - 14 x 6 x 5 mm area of subpleural nodularity versus atelectasis or infiltrate in RIGHT middle lobe adjacent to minor fissure; short-term follow-up CT chest recommended in 4 weeks to assess for persistence. He should f/u PCP to discuss.  3. Tobacco abuse - cessation advised.  4. Hyperlipemia - Pt reports inability to tolerate multiple statins due to myalgias, arthralgias and fatigue. LDL 132. With his known CAD and LDL goal of <70 not met, he should be referred to our lipid clinic for initiation of PCSK9 inhibitor after discharge.  5. Right leg cramps - Resolved, no complaints this AM in nuclear medicine   6. Thrombocytopenia - seems to always have mild decrease but lower today, per IM.  For questions or updates, please contact [facility] consult [email or web address] for contact info under Cardiology/STEMI.  Signed, DaCharlie PitterPA-C -> patient pre-noted, but out of room when I went to meet him. Have signed out to JiKathyrn Drowno review. 01/24/2018, 7:56 AM    JiKathyrn DrownP-C [facility] Pager: [phone]  Attending Addendum: History and all data above reviewed.  Patient examined.  I agree with the findings as above. All available labs, radiology testing, previous records reviewed. Agree with documented assessment and plan. Justin Santana is a 63M with with known obstructive CAD that has been medically managed, status post PCI, ongoing tobacco abuse, obesity, hypertension, and poorly controlled hyperlipidemia here with exertional shoulder pain.  Symptoms have resolved.  Cardiac enzymes negative x3 and EKG is without ischemic changes.  Echo shows normal wall motion and LVEF with a mild ascending aorta aneurysm.  He will need a repeat echo in 1 year.  Although he has known obstructive CAD, it isn't clear  that these symptoms are ischemic in nature.  Results of Lexiscan Myoview are pending.  If negative he will be OK for discharge.  We discussed the importance of lifestyle modification.  Smoking cessation, diet and exercise will be key.  He is very interested in starting Repatha, which can be arranged as an outpatient.   Justin Santana C. Oval Linsey, MD, [facility]  01/24/2018 12:04 PM

## 2018-01-24 NOTE — Progress Notes (Signed)
    Pt underwent nuclear stress test today without complication which revealed no reversible ischemia or infarction, normal left ventricular wall motion and estimated LVEF of 51%. This is considered a low risk study. Spoke with Dr. Oval Linsey who feels that he is stable and ok to discharge today, 01/24/18. Primary team has been notified.   Kathyrn Drown NP-C [place] Pager: [phone]

## 2018-01-24 NOTE — Progress Notes (Addendum)
   Justin Santana presented for a nuclear stress test today.  No immediate complications.  Stress imaging is pending at this time.  Preliminary EKG findings may be listed in the chart, but the stress test result will not be finalized until perfusion imaging is complete.   Kathyrn Drown, NP-C 01/24/2018, 9:15 AM

## 2018-01-24 NOTE — Discharge Summary (Signed)
Physician Discharge Summary   Patient ID: Justin Santana MRN: [id number] DOB/AGE: March 11, 1966 52 y.o.  Admit date: 01/23/2018 Discharge date: 01/24/2018  Primary Care Physician:  System, Pcp Not In   Recommendations for Outpatient Follow-up:  1. Follow up with PCP in 1-2 weeks  Home Health: None  Equipment/Devices:   Discharge Condition: stable  CODE STATUS: FULL  Diet recommendation: Healthy diet   Discharge Diagnoses:   . Atypical chest pain . HLD (hyperlipidemia) . Coronary atherosclerosis of native coronary artery . Tobacco abuse  Consults: Cardiology    Allergies:   Allergies  Allergen Reactions  . Aleve [Naproxen Sodium] Shortness Of Breath and Other (See Comments)    Sweating, difficulty breathing  . Iodine Other (See Comments)    Blistering of skin  . Statins     Severe Myopathy, joint pains     DISCHARGE MEDICATIONS: Allergies as of 01/24/2018      Reactions   Aleve [naproxen Sodium] Shortness Of Breath, Other (See Comments)   Sweating, difficulty breathing   Iodine Other (See Comments)   Blistering of skin   Statins    Severe Myopathy, joint pains      Medication List    TAKE these medications   aspirin 81 MG EC tablet Take 1 tablet (81 mg total) by mouth daily.   losartan 25 MG tablet Commonly known as:  COZAAR Take 0.5 tablets (12.5 mg total) by mouth daily. What changed:  See the new instructions.        Brief H and P: For complete details please refer to admission H and P, but in brief 52 year old male with a history of acute myocardial infarction in the past in 2015, hypertension, smoker,previous cardiac cath revealing total occlusion of total occlusion of the OM1. There was also severe proximal/mid LAD stenosis. Patent, dominant RCA with minor nonobstructive disease. Mild segmental LV dysfunction was noted with an EF of 55%. He underwent successful PCI utilizing a DES to the totally occluded OM1. Staged PCI of the LAD prior to discharge  was recommended. Post cath today patient was very anxious and left AMA without addressing his LAD lesion. Subsequently has seen Dr. [name] in [place]. He was advised to undergo another cardiac catheterization 8 months ago .  Presented to ED with acute onset of left shoulder pain brought on by lifting a heavy weight, lasting greater than 15 minutes. When he called EMS EMS found him to be pale and diaphoretic and administered 4 baby aspirin 6 and 2 sublingual nitros.  No prior episodes of chest pain.   Hospital Course:  Chest pain with history of coronary disease -Serial troponins negative, EKG did not show acute ischemic changes -CT angiogram of the chest negative for PE -2D echo showed EF of 55 to 66%, grade 1 diastolic dysfunction, no wall motion ab normality -Cardiology was consulted and recommended nuclear medicine stress test -Stress test showed EF 51%, no reversible ischemia, low risk study -LDL 132, patient however declined statins due to prior severe side effects of myopathy. -Repeat echo in 1 year.     Tobacco abuse -Patient was counseled smoking cessation    HLD (hyperlipidemia) Declined statins,-due to previous side effects.  Interested in [employer] habitus as outpatient.  Right middle lobe nodule CT angiogram of the chest incidentally showed 14x 6x 5 mm area of subpleural nodularity versus atelectasis or infiltrate in the right middle lobe adjacent to minor fissure, follow-up CT chest recommended in 4 weeks Currently no symptoms of pneumonia.  No fevers  or leukocytosis.  Ascending thoracic aortic aneurysm CT angiogram of the chest showed aneurysmal dilatation of ascending aorta thoracic 4.0 cm, recommended annual imaging by CTA or MRA  Day of Discharge S: No further chest pain episodes, no shortness of breath, no palpitation  BP 139/76   Pulse 62   Temp 97.7 F (36.5 C) (Oral)   Resp 18   Ht 5\' 10"  (1.778 m)   Wt 90.7 kg (200 lb)   SpO2 95%   BMI 28.70 kg/m   Physical  Exam: General: Alert and awake oriented x3 not in any acute distress. HEENT: anicteric sclera, pupils reactive to light and accommodation CVS: S1-S2 clear no murmur rubs or gallops Chest: clear to auscultation bilaterally, no wheezing rales or rhonchi Abdomen: soft nontender, nondistended, normal bowel sounds Extremities: no cyanosis, clubbing or edema noted bilaterally Neuro: Cranial nerves II-XII intact, no focal neurological deficits   The results of significant diagnostics from this hospitalization (including imaging, microbiology, ancillary and laboratory) are listed below for reference.      Procedures/Studies: Stress test  IMPRESSION: 1. No reversible ischemia or infarction.  2. Normal left ventricular wall motion.  3. Left ventricular ejection fraction is 51%.  4. Non invasive risk stratification*: Low    2Decho Study Conclusions  - Left ventricle: The cavity size was normal. Wall thickness was   normal. Systolic function was normal. The estimated ejection   fraction was in the range of 55% to 60%. Wall motion was normal;   there were no regional wall motion abnormalities. Doppler   parameters are consistent with abnormal left ventricular   relaxation (grade 1 diastolic dysfunction). - Aortic valve: There was mild regurgitation. - Aorta: Aortic root dimension: 42 mm (ED). - Ascending aorta: The ascending aorta was mildly dilated. Dg Chest 2 View  Result Date: 01/23/2018 CLINICAL DATA:  Chest and left shoulder pain since this morning with shortness of breath. Smoker. EXAM: CHEST - 2 VIEW COMPARISON:  11/28/2017. FINDINGS: Normal sized heart. Clear lungs. Stable diffusely prominent interstitial markings. Normal vascularity. Minimal thoracolumbar spine degenerative changes. IMPRESSION: No acute abnormality. Stable chronic interstitial lung disease compatible with the history of smoking. Electronically Signed   By: Claudie Revering M.D.   On: 01/23/2018 11:23   Ct Angio  Chest Pe W And/or Wo Contrast  Result Date: 01/23/2018 CLINICAL DATA:  Shortness of breath and back pain since 0300 hours, history coronary disease post MI, hypertension, smoker EXAM: CT ANGIOGRAPHY CHEST WITH CONTRAST TECHNIQUE: Multidetector CT imaging of the chest was performed using the standard protocol during bolus administration of intravenous contrast. Multiplanar CT image reconstructions and MIPs were obtained to evaluate the vascular anatomy. CONTRAST:  165mL ISOVUE-370 IOPAMIDOL (ISOVUE-370) INJECTION 76% IV COMPARISON:  None FINDINGS: Cardiovascular: Mild aneurysmal dilatation of the ascending thoracic aorta 4.0 cm diameter. No aortic dissection. Coronary arterial calcifications present. No pericardial effusion. Excellent opacification of the pulmonary arteries. No pulmonary arterial filling defects identified to suggest pulmonary embolism. Mediastinum/Nodes: Esophagus unremarkable. Base of cervical region normal appearance. Few normal sized mediastinal and RIGHT hilar lymph nodes. Without thoracic adenopathy. Lungs/Pleura: Central peribronchial thickening. Peripheral area of nodularity versus atelectasis or infiltrate RIGHT middle lobe 14 x 6 x 78mm diameter image 51. Minimal atelectasis RIGHT base. Remaining lungs clear. No additional pulmonary infiltrate, pleural effusion, or pneumothorax. Upper Abdomen: Visualized upper abdomen unremarkable. Musculoskeletal: No acute osseous findings. Review of the MIP images confirms the above findings. IMPRESSION: No evidence of pulmonary embolism. 14 x 6 x 5 mm area of  subpleural nodularity versus atelectasis or infiltrate in RIGHT middle lobe adjacent to minor fissure; short-term follow-up CT chest recommended in 4 weeks to assess for persistence. Atherosclerotic changes including coronary arterial calcification and aneurysmal dilatation of the ascending thoracic aorta 4.0 cm greatest size; recommendation below. Recommend annual imaging followup by CTA or MRA.  This recommendation follows 2010 ACCF/AHA/AATS/ACR/ASA/SCA/SCAI/SIR/STS/SVM Guidelines for the Diagnosis and Management of Patients with Thoracic Aortic Disease. Circulation. 2010; 121: L893-T342 Aortic Atherosclerosis (ICD10-I70.0). Aortic aneurysm NOS (ICD10-I71.9). Electronically Signed   By: Lavonia Dana M.D.   On: 01/23/2018 14:17      LAB RESULTS: Basic Metabolic Panel: Recent Labs  Lab 01/23/18 1035 01/23/18 1449 01/24/18 0401  NA 139  --  138  K 4.9  --  4.5  CL 106  --  109  CO2 25  --  24  GLUCOSE 90  --  105*  BUN 17  --  15  CREATININE 1.12  --  1.11  CALCIUM 8.8*  --  8.4*  MG  --  1.9  --    Liver Function Tests: Recent Labs  Lab 01/24/18 0401  AST 18  ALT 18  ALKPHOS 63  BILITOT 0.9  PROT 5.8*  ALBUMIN 3.3*   No results for input(s): LIPASE, AMYLASE in the last 168 hours. No results for input(s): AMMONIA in the last 168 hours. CBC: Recent Labs  Lab 01/23/18 1035 01/24/18 0401  WBC 10.0 7.3  NEUTROABS 6.7  --   HGB 15.2 14.9  HCT 44.7 45.0  MCV 89.9 92.2  PLT 147* 114*   Cardiac Enzymes: Recent Labs  Lab 01/23/18 2056 01/24/18 0401  TROPONINI <0.03 <0.03   BNP: Invalid input(s): POCBNP CBG: No results for input(s): GLUCAP in the last 168 hours.    Disposition and Follow-up:    DISPOSITION: Home   DISCHARGE FOLLOW-UP Follow-up Information    Arnoldo Lenis, MD. Schedule an appointment as soon as possible for a visit in 2 week(s).   Specialty:  Cardiology Contact information: [address] [place] [address] [phone]            Time coordinating discharge:  35 minutes  Signed:   Estill Cotta M.D. [facility] 01/24/2018, 2:17 PM Pager: [phone]

## 2018-01-24 NOTE — Progress Notes (Signed)
  Echocardiogram 2D Echocardiogram has been performed.  Jennette Dubin 01/24/2018, 11:26 AM

## 2018-01-24 NOTE — Plan of Care (Signed)
  Problem: Health Behavior/Discharge Planning: Goal: Ability to manage health-related needs will improve Outcome: Progressing   

## 2018-01-27 ENCOUNTER — Telehealth: Payer: Self-pay | Admitting: *Deleted

## 2018-01-27 MED ORDER — EZETIMIBE 10 MG PO TABS: 10.0000 mg | ORAL_TABLET | Freq: Every day | ORAL | 1 refills | Status: DC

## 2018-01-27 NOTE — Telephone Encounter (Signed)
I would first try him on a pill called zetia 10mg  daily. This is a nonstatin cholesterol medicine that can lower the cholesterol. If does not tolerate that then I would consider the pcsk9 inhibitor, which is an injection and more expensive but works very well   [name] MD

## 2018-01-27 NOTE — Telephone Encounter (Signed)
Pt aware.

## 2018-01-27 NOTE — Telephone Encounter (Signed)
His stress test looked fine, if he is not having any symptoms ok to return to work without restriction on Thursday  J Annamarie Yamaguchi MD

## 2018-01-27 NOTE — Telephone Encounter (Signed)
Pt says recent hospital admission was told that he would need to start a PSK9 medication and also wanted to know when to f/u with Dr [name] and if Dr [name] would proscribe this or referral needed for lipids clinic. 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

## 2018-01-27 NOTE — Telephone Encounter (Signed)
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

## 2018-02-07 ENCOUNTER — Other Ambulatory Visit: Payer: Self-pay | Admitting: Cardiology

## 2018-02-18 ENCOUNTER — Ambulatory Visit (INDEPENDENT_AMBULATORY_CARE_PROVIDER_SITE_OTHER): Payer: BC Managed Care – PPO | Admitting: Cardiology

## 2018-02-18 ENCOUNTER — Encounter: Payer: Self-pay | Admitting: Cardiology

## 2018-02-18 VITALS — BP 111/68 | HR 79 | Ht 70.0 in | Wt 204.0 lb

## 2018-02-18 DIAGNOSIS — E782 Mixed hyperlipidemia: Secondary | ICD-10-CM | POA: Diagnosis not present

## 2018-02-18 DIAGNOSIS — I2510 Atherosclerotic heart disease of native coronary artery without angina pectoris: Secondary | ICD-10-CM

## 2018-02-18 NOTE — Patient Instructions (Signed)
Medication Instructions:  Your physician recommends that you continue on your current medications as directed. Please refer to the Current Medication list given to you today.  Labwork: NONE  Testing/Procedures: NONE  Follow-Up: Your physician wants you to follow-up in: 6 MONTHS WITH DR. BRANCH. You will receive a reminder letter in the mail two months in advance. If you don't receive a letter, please call our office to schedule the follow-up appointment.  Any Other Special Instructions Will Be Listed Below (If Applicable).  If you need a refill on your cardiac medications before your next appointment, please call your pharmacy. 
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
## 2018-02-24 ENCOUNTER — Encounter: Payer: Self-pay | Admitting: Cardiology

## 2018-04-27 ENCOUNTER — Other Ambulatory Visit: Payer: Self-pay | Admitting: Cardiology

## 2018-07-14 ENCOUNTER — Other Ambulatory Visit: Payer: Self-pay | Admitting: Cardiology

## 2018-08-05 DIAGNOSIS — L409 Psoriasis, unspecified: Secondary | ICD-10-CM | POA: Diagnosis not present

## 2018-08-05 DIAGNOSIS — Z6829 Body mass index (BMI) 29.0-29.9, adult: Secondary | ICD-10-CM | POA: Diagnosis not present

## 2018-08-05 DIAGNOSIS — J189 Pneumonia, unspecified organism: Secondary | ICD-10-CM | POA: Diagnosis not present

## 2018-08-05 DIAGNOSIS — F17210 Nicotine dependence, cigarettes, uncomplicated: Secondary | ICD-10-CM | POA: Diagnosis not present

## 2018-08-19 DIAGNOSIS — R918 Other nonspecific abnormal finding of lung field: Secondary | ICD-10-CM | POA: Diagnosis not present

## 2018-08-19 DIAGNOSIS — I700 Atherosclerosis of aorta: Secondary | ICD-10-CM | POA: Diagnosis not present

## 2018-08-19 DIAGNOSIS — R0600 Dyspnea, unspecified: Secondary | ICD-10-CM | POA: Diagnosis not present

## 2018-08-19 DIAGNOSIS — J439 Emphysema, unspecified: Secondary | ICD-10-CM | POA: Diagnosis not present

## 2018-08-19 DIAGNOSIS — R05 Cough: Secondary | ICD-10-CM | POA: Diagnosis not present

## 2018-08-27 DIAGNOSIS — J84115 Respiratory bronchiolitis interstitial lung disease: Secondary | ICD-10-CM | POA: Diagnosis not present

## 2018-08-27 DIAGNOSIS — R918 Other nonspecific abnormal finding of lung field: Secondary | ICD-10-CM | POA: Diagnosis not present

## 2018-08-27 DIAGNOSIS — F17210 Nicotine dependence, cigarettes, uncomplicated: Secondary | ICD-10-CM | POA: Diagnosis not present

## 2018-08-27 DIAGNOSIS — J449 Chronic obstructive pulmonary disease, unspecified: Secondary | ICD-10-CM | POA: Diagnosis not present

## 2018-08-27 DIAGNOSIS — Z6829 Body mass index (BMI) 29.0-29.9, adult: Secondary | ICD-10-CM | POA: Diagnosis not present

## 2018-09-03 ENCOUNTER — Telehealth: Payer: Self-pay | Admitting: Cardiology

## 2018-09-03 DIAGNOSIS — F17200 Nicotine dependence, unspecified, uncomplicated: Secondary | ICD-10-CM | POA: Diagnosis not present

## 2018-09-03 DIAGNOSIS — J449 Chronic obstructive pulmonary disease, unspecified: Secondary | ICD-10-CM | POA: Diagnosis not present

## 2018-09-03 DIAGNOSIS — I2510 Atherosclerotic heart disease of native coronary artery without angina pectoris: Secondary | ICD-10-CM | POA: Diagnosis not present

## 2018-09-03 DIAGNOSIS — J84115 Respiratory bronchiolitis interstitial lung disease: Secondary | ICD-10-CM | POA: Diagnosis not present

## 2018-09-03 NOTE — Telephone Encounter (Signed)
Patient called stating that he needs to go over his medication list before 2pm today. He has an upcoming appointment with another doctor.Marland Kitchen

## 2018-09-03 NOTE — Telephone Encounter (Signed)
Returned call - stated he was seeing Pulmonologist today & wanted to make sure his medication list was accurate.  Reviewed medication list with patient.  Appreciative of the call back.

## 2018-09-04 ENCOUNTER — Other Ambulatory Visit (HOSPITAL_COMMUNITY): Payer: Self-pay | Admitting: Respiratory Therapy

## 2018-09-04 DIAGNOSIS — J441 Chronic obstructive pulmonary disease with (acute) exacerbation: Secondary | ICD-10-CM

## 2018-10-01 ENCOUNTER — Ambulatory Visit (HOSPITAL_COMMUNITY)
Admission: RE | Admit: 2018-10-01 | Discharge: 2018-10-01 | Disposition: A | Discharge status: Home / Self Care | Payer: BC Managed Care – PPO | Source: Ambulatory Visit | Attending: Pulmonary Disease | Admitting: Pulmonary Disease

## 2018-10-01 DIAGNOSIS — J441 Chronic obstructive pulmonary disease with (acute) exacerbation: Secondary | ICD-10-CM | POA: Insufficient documentation

## 2018-10-01 LAB — PULMONARY FUNCTION TEST
DL/VA % pred: 82 %
DL/VA: 3.55 ml/min/mmHg/L
DLCO unc % pred: 51 %
DLCO unc: 15.09 ml/min/mmHg
FEF 25-75 Post: 2.19 L/sec
FEF 25-75 Pre: 2.28 L/sec
FEF2575-%Change-Post: -4 %
FEF2575-%Pred-Post: 65 %
FEF2575-%Pred-Pre: 68 %
FEV1-%Change-Post: 0 %
FEV1-%Pred-Post: 54 %
FEV1-%Pred-Pre: 54 %
FEV1-Post: 2.11 L
FEV1-Pre: 2.12 L
FEV1FVC-%Change-Post: -1 %
FEV1FVC-%Pred-Pre: 106 %
FEV6-%Change-Post: 1 %
FEV6-%Pred-Post: 53 %
FEV6-%Pred-Pre: 53 %
FEV6-Post: 2.63 L
FEV6-Pre: 2.6 L
FEV6FVC-%Pred-Post: 104 %
FEV6FVC-%Pred-Pre: 104 %
FVC-%Change-Post: 1 %
FVC-%Pred-Post: 51 %
FVC-%Pred-Pre: 51 %
FVC-Post: 2.63 L
FVC-Pre: 2.6 L
Post FEV1/FVC ratio: 80 %
Post FEV6/FVC ratio: 100 %
Pre FEV1/FVC ratio: 81 %
Pre FEV6/FVC Ratio: 100 %
RV % pred: 97 %
RV: 2.13 L
TLC % pred: 67 %
TLC: 4.85 L

## 2018-10-01 MED ORDER — ALBUTEROL SULFATE (2.5 MG/3ML) 0.083% IN NEBU
2.5000 mg | INHALATION_SOLUTION | Freq: Once | RESPIRATORY_TRACT | Status: AC
  Administered 2018-10-01: 2.5 mg via RESPIRATORY_TRACT

## 2018-10-21 DIAGNOSIS — J449 Chronic obstructive pulmonary disease, unspecified: Secondary | ICD-10-CM | POA: Diagnosis not present

## 2018-10-21 DIAGNOSIS — F17200 Nicotine dependence, unspecified, uncomplicated: Secondary | ICD-10-CM | POA: Diagnosis not present

## 2018-10-21 DIAGNOSIS — J84115 Respiratory bronchiolitis interstitial lung disease: Secondary | ICD-10-CM | POA: Diagnosis not present

## 2018-10-21 DIAGNOSIS — I2510 Atherosclerotic heart disease of native coronary artery without angina pectoris: Secondary | ICD-10-CM | POA: Diagnosis not present

## 2018-10-24 ENCOUNTER — Other Ambulatory Visit: Payer: Self-pay | Admitting: Cardiology

## 2018-10-30 ENCOUNTER — Ambulatory Visit: Payer: BC Managed Care – PPO | Admitting: Cardiology

## 2018-11-19 ENCOUNTER — Telehealth: Payer: Self-pay | Admitting: Cardiology

## 2018-11-19 ENCOUNTER — Encounter: Payer: Self-pay | Admitting: *Deleted

## 2018-11-19 NOTE — Telephone Encounter (Signed)
Pt aware and will come by [facility] office tomorrow after 2pm to pick up this letter (saved in letters in [place]) front office staff notified

## 2018-11-19 NOTE — Telephone Encounter (Signed)
Patient states that he needs documentation on how his heart condition puts him at risk for COVID 19/tg

## 2018-11-19 NOTE — Telephone Encounter (Signed)
Can we get more information exactly what he needs this for and who he needs this for.   [name] MD

## 2018-11-19 NOTE — Telephone Encounter (Signed)
I read we are not providing documentation or letters regarding this, is that correct?

## 2018-11-19 NOTE — Telephone Encounter (Signed)
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

## 2018-11-19 NOTE — Telephone Encounter (Signed)
Please provide the following in a letter   To whom it may concern,  Mr Justin Santana is followed in our cardiology clinic for his history of coronary artery disease. Recent data has shown an increased risk for adverse outcomes in cardiac patients that contract the corona virus, and thus given [name]'s history if he were to contract the virus he would be at higher risk. Please consider any steps that may be taken to limit the risk of [name]'s exposure, if exemption from duties is possible I would consider that as he should be fully participating in social distancing measures to limit his risk.    [name] MD

## 2018-12-17 ENCOUNTER — Telehealth (INDEPENDENT_AMBULATORY_CARE_PROVIDER_SITE_OTHER): Payer: BC Managed Care – PPO | Admitting: Cardiology

## 2018-12-17 ENCOUNTER — Encounter: Payer: Self-pay | Admitting: Cardiology

## 2018-12-17 VITALS — Ht 70.0 in | Wt 200.0 lb

## 2018-12-17 DIAGNOSIS — R942 Abnormal results of pulmonary function studies: Secondary | ICD-10-CM | POA: Diagnosis not present

## 2018-12-17 DIAGNOSIS — E785 Hyperlipidemia, unspecified: Secondary | ICD-10-CM

## 2018-12-17 DIAGNOSIS — R252 Cramp and spasm: Secondary | ICD-10-CM | POA: Diagnosis not present

## 2018-12-17 DIAGNOSIS — I2510 Atherosclerotic heart disease of native coronary artery without angina pectoris: Secondary | ICD-10-CM

## 2018-12-17 DIAGNOSIS — E782 Mixed hyperlipidemia: Secondary | ICD-10-CM

## 2018-12-17 DIAGNOSIS — I719 Aortic aneurysm of unspecified site, without rupture: Secondary | ICD-10-CM

## 2018-12-17 NOTE — Patient Instructions (Signed)
Your physician wants you to follow-up in: [place] will receive a reminder letter in the mail two months in advance. If you don't receive a letter, please call our office to schedule the follow-up appointment.  Your physician has recommended you make the following change in your medication:   HOLD ZETIA 2 WEEKS AND CALL us WITH AN UPDATE ON YOUR LEG PAIN   Thank you for choosing [facility]!!
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
## 2019-01-19 ENCOUNTER — Other Ambulatory Visit: Payer: Self-pay

## 2019-01-19 MED ORDER — EZETIMIBE 10 MG PO TABS: 10.0000 mg | ORAL_TABLET | Freq: Every day | ORAL | 3 refills | Status: DC

## 2019-01-19 NOTE — Telephone Encounter (Signed)
refilled zetia

## 2019-01-29 ENCOUNTER — Telehealth: Payer: Self-pay | Admitting: Cardiology

## 2019-01-29 NOTE — Telephone Encounter (Signed)
Ok to create return to work note? We had created one in April for him to practice social distancing and limit exposure at work

## 2019-01-29 NOTE — Telephone Encounter (Signed)
Pt is needing a note to return back to work for the [employer] since he was at a higher risk due to his heart and lung conditions and they wouldn't let him come to work. They're requiring him to have a note to be able to return to work.   Can be faxed to [phone] or [phone]

## 2019-01-29 NOTE — Telephone Encounter (Signed)
Ok to generate return to work note. Though he is higher risk given his history of heart disease the risk does not neccesarily warrant him having to be out of work for the entire duration of the pandemic which unfortunately is likely to be several months. Ok to return to work, would still recommend social distancing and wearing a mask as able during his duties.    [name] MD

## 2019-01-30 ENCOUNTER — Encounter: Payer: Self-pay | Admitting: *Deleted

## 2019-01-30 NOTE — Telephone Encounter (Signed)
Letter sent and pt aware ?

## 2019-02-25 ENCOUNTER — Telehealth: Payer: Self-pay | Admitting: Cardiology

## 2019-02-25 ENCOUNTER — Encounter: Payer: Self-pay | Admitting: *Deleted

## 2019-02-25 NOTE — Telephone Encounter (Signed)
Patient called stating that he had blood work done with [facility]. He received a call last evening from [facility] stating that his Triglycerides were elevated. He wanted to find out if Dr. Harl Santana wants him to increase his medication.   [phone]) please leave message if he does not answer.

## 2019-02-26 NOTE — Telephone Encounter (Signed)
mychart message sent

## 2019-02-26 NOTE — Telephone Encounter (Signed)
Typically the main treatment for triglycerides is diet changes and weight loss. Cutting back on fatty/fried foods, sweets/ cookies/pastries/sodas/alcohol/procressed foods. Typically we don't use a medicine for triglycerides unless they are extremely elevated, how high were his?. Is he back on the zetia, last we spoke he was going to hold for a few weeks to see if leg pains improved. Zetia mainly works by lowering LDL which is the most important type of cholesterol.    J BrancH MD

## 2019-02-26 NOTE — Telephone Encounter (Signed)
I also did not mention that if the test was not fasting then yes that would also explain an elevated triglycerides, as after a meal those levels normal elevate and then trend down. Would work on diet and weight loss, for next lipid panel be sure to be fasting.   J Angeleena Dueitt MD

## 2019-02-26 NOTE — Telephone Encounter (Signed)
Patient informed and verbalized understanding. Reports eating hash browns right before lab work was done. Reports never stopping zetia and does not have leg cramps any longer since restarting work. Reports losing weight since going back to work.

## 2019-03-20 DIAGNOSIS — M25511 Pain in right shoulder: Secondary | ICD-10-CM | POA: Diagnosis not present

## 2019-05-08 ENCOUNTER — Other Ambulatory Visit: Payer: Self-pay | Admitting: Cardiology

## 2019-06-19 ENCOUNTER — Ambulatory Visit: Payer: BC Managed Care – PPO | Admitting: Cardiology

## 2019-06-22 ENCOUNTER — Telehealth: Payer: Self-pay | Admitting: Cardiology

## 2019-06-22 NOTE — Telephone Encounter (Signed)
Juliann Pulse at [facility] called saying the pt's generic ezetimibe (ZETIA) 10 MG tablet [id number]  copay is $32.20 for a 90 day supply and the patient can't afford it. Would like to know if Dr. [name] would switch this to something different.  According to his insurance this is a Tier 3 drug and he's looking for something like a Tier 1.   Please call Juliann Pulse @ [phone]

## 2019-06-23 NOTE — Telephone Encounter (Signed)
Allergic to statins. Only other options are zetia which he is currently on, or repatha or praluent which are injections and likely to be more expensive unless he gets some form of assistance. Did the copay go up, is he in the donut hole perhaps? Has his pcp repeated his cholesterol recently?   [name] MD

## 2019-06-24 MED ORDER — EZETIMIBE 10 MG PO TABS: 10.0000 mg | ORAL_TABLET | Freq: Every day | ORAL | 3 refills | Status: DC

## 2019-06-24 NOTE — Telephone Encounter (Signed)
No labs since July 2020 and is willing to repeat  Co-pay increased gradually throughout the year ($5 to $36) Can no longer afford due to income reducing by $1000/mth  Advised that I would call around and price check for lower price of zetia and send information to [place] once complete.

## 2019-06-24 NOTE — Telephone Encounter (Signed)
Patient informed and will get zetia from [facility].

## 2019-06-24 NOTE — Telephone Encounter (Addendum)
90 day supply of zetia 10 mg at [place] [facility] is $20 & 30 day supply is $8

## 2019-07-20 DIAGNOSIS — I10 Essential (primary) hypertension: Secondary | ICD-10-CM | POA: Diagnosis not present

## 2019-07-20 DIAGNOSIS — E782 Mixed hyperlipidemia: Secondary | ICD-10-CM | POA: Diagnosis not present

## 2019-08-20 DIAGNOSIS — I10 Essential (primary) hypertension: Secondary | ICD-10-CM | POA: Diagnosis not present

## 2019-08-20 DIAGNOSIS — E782 Mixed hyperlipidemia: Secondary | ICD-10-CM | POA: Diagnosis not present

## 2019-09-18 ENCOUNTER — Ambulatory Visit: Payer: BC Managed Care – PPO | Admitting: Cardiology

## 2019-09-18 DIAGNOSIS — I10 Essential (primary) hypertension: Secondary | ICD-10-CM | POA: Diagnosis not present

## 2019-09-18 DIAGNOSIS — E7849 Other hyperlipidemia: Secondary | ICD-10-CM | POA: Diagnosis not present

## 2019-09-22 ENCOUNTER — Other Ambulatory Visit: Payer: Self-pay | Admitting: Orthopedic Surgery

## 2019-09-22 DIAGNOSIS — M533 Sacrococcygeal disorders, not elsewhere classified: Secondary | ICD-10-CM

## 2019-09-28 ENCOUNTER — Ambulatory Visit
Admission: RE | Admit: 2019-09-28 | Discharge: 2019-09-28 | Disposition: A | Discharge status: Home / Self Care | Payer: No Typology Code available for payment source | Source: Ambulatory Visit | Attending: Orthopedic Surgery | Admitting: Orthopedic Surgery

## 2019-09-28 ENCOUNTER — Other Ambulatory Visit: Payer: Self-pay

## 2019-09-28 ENCOUNTER — Ambulatory Visit
Admission: RE | Admit: 2019-09-28 | Discharge: 2019-09-28 | Disposition: A | Discharge status: Home / Self Care | Payer: Self-pay | Source: Ambulatory Visit | Attending: Orthopedic Surgery | Admitting: Orthopedic Surgery

## 2019-09-28 DIAGNOSIS — M533 Sacrococcygeal disorders, not elsewhere classified: Secondary | ICD-10-CM

## 2019-10-08 ENCOUNTER — Other Ambulatory Visit: Payer: Self-pay | Admitting: Orthopedic Surgery

## 2019-10-13 ENCOUNTER — Other Ambulatory Visit: Payer: Self-pay | Admitting: Orthopedic Surgery

## 2019-10-13 DIAGNOSIS — M533 Sacrococcygeal disorders, not elsewhere classified: Secondary | ICD-10-CM

## 2019-10-22 ENCOUNTER — Telehealth: Payer: Self-pay | Admitting: Cardiology

## 2019-10-22 NOTE — Telephone Encounter (Signed)
Pt had first dose of Moderna vaccine yesterday and c/o slight fever/fatigue/cough/SOB - woke up today denies fever bust still c/o SOB and fatigue - pt aware that these were normal side affects of vaccine 24-48 hours after and that if symptoms worsen to contact us/pcp or ED - pt voiced understanding

## 2019-10-22 NOTE — Telephone Encounter (Signed)
Patient called wanting to know if we could give him the side effects of the Moderna vaccine.

## 2019-10-23 ENCOUNTER — Ambulatory Visit
Admission: RE | Admit: 2019-10-23 | Discharge: 2019-10-23 | Disposition: A | Discharge status: Home / Self Care | Payer: No Typology Code available for payment source | Source: Ambulatory Visit | Attending: Orthopedic Surgery | Admitting: Orthopedic Surgery

## 2019-10-23 ENCOUNTER — Other Ambulatory Visit: Payer: Self-pay

## 2019-10-23 DIAGNOSIS — M533 Sacrococcygeal disorders, not elsewhere classified: Secondary | ICD-10-CM

## 2019-10-23 MED ORDER — METHYLPREDNISOLONE ACETATE 40 MG/ML INJ SUSP (RADIOLOG
120.0000 mg | Freq: Once | INTRAMUSCULAR | Status: AC
  Administered 2019-10-23: 120 mg via INTRA_ARTICULAR

## 2019-10-25 ENCOUNTER — Ambulatory Visit: Payer: Self-pay

## 2019-10-30 ENCOUNTER — Other Ambulatory Visit: Payer: Self-pay | Admitting: Cardiology

## 2019-11-18 DIAGNOSIS — E7849 Other hyperlipidemia: Secondary | ICD-10-CM | POA: Diagnosis not present

## 2019-11-18 DIAGNOSIS — I10 Essential (primary) hypertension: Secondary | ICD-10-CM | POA: Diagnosis not present

## 2019-11-23 DIAGNOSIS — I10 Essential (primary) hypertension: Secondary | ICD-10-CM | POA: Diagnosis not present

## 2019-11-23 DIAGNOSIS — I2510 Atherosclerotic heart disease of native coronary artery without angina pectoris: Secondary | ICD-10-CM | POA: Diagnosis not present

## 2019-11-23 DIAGNOSIS — H548 Legal blindness, as defined in USA: Secondary | ICD-10-CM | POA: Diagnosis not present

## 2019-11-23 DIAGNOSIS — J449 Chronic obstructive pulmonary disease, unspecified: Secondary | ICD-10-CM | POA: Diagnosis not present

## 2019-11-23 DIAGNOSIS — Z0001 Encounter for general adult medical examination with abnormal findings: Secondary | ICD-10-CM | POA: Diagnosis not present

## 2019-11-23 DIAGNOSIS — R918 Other nonspecific abnormal finding of lung field: Secondary | ICD-10-CM | POA: Diagnosis not present

## 2019-11-23 DIAGNOSIS — R3915 Urgency of urination: Secondary | ICD-10-CM | POA: Diagnosis not present

## 2019-11-23 DIAGNOSIS — R252 Cramp and spasm: Secondary | ICD-10-CM | POA: Diagnosis not present

## 2019-11-23 DIAGNOSIS — L409 Psoriasis, unspecified: Secondary | ICD-10-CM | POA: Diagnosis not present

## 2019-11-23 DIAGNOSIS — E782 Mixed hyperlipidemia: Secondary | ICD-10-CM | POA: Diagnosis not present

## 2019-11-23 DIAGNOSIS — Z6830 Body mass index (BMI) 30.0-30.9, adult: Secondary | ICD-10-CM | POA: Diagnosis not present

## 2019-11-23 DIAGNOSIS — F17210 Nicotine dependence, cigarettes, uncomplicated: Secondary | ICD-10-CM | POA: Diagnosis not present

## 2019-12-04 DIAGNOSIS — J439 Emphysema, unspecified: Secondary | ICD-10-CM | POA: Diagnosis not present

## 2019-12-04 DIAGNOSIS — I700 Atherosclerosis of aorta: Secondary | ICD-10-CM | POA: Diagnosis not present

## 2019-12-04 DIAGNOSIS — K8020 Calculus of gallbladder without cholecystitis without obstruction: Secondary | ICD-10-CM | POA: Diagnosis not present

## 2019-12-04 DIAGNOSIS — F17210 Nicotine dependence, cigarettes, uncomplicated: Secondary | ICD-10-CM | POA: Diagnosis not present

## 2019-12-04 DIAGNOSIS — R918 Other nonspecific abnormal finding of lung field: Secondary | ICD-10-CM | POA: Diagnosis not present

## 2019-12-22 DIAGNOSIS — Z20828 Contact with and (suspected) exposure to other viral communicable diseases: Secondary | ICD-10-CM | POA: Diagnosis not present

## 2019-12-22 DIAGNOSIS — R591 Generalized enlarged lymph nodes: Secondary | ICD-10-CM | POA: Diagnosis not present

## 2019-12-22 DIAGNOSIS — J441 Chronic obstructive pulmonary disease with (acute) exacerbation: Secondary | ICD-10-CM | POA: Diagnosis not present

## 2019-12-25 ENCOUNTER — Ambulatory Visit (INDEPENDENT_AMBULATORY_CARE_PROVIDER_SITE_OTHER): Payer: PPO | Admitting: Cardiology

## 2019-12-25 ENCOUNTER — Telehealth: Payer: Self-pay | Admitting: *Deleted

## 2019-12-25 ENCOUNTER — Telehealth: Payer: Self-pay | Admitting: Cardiology

## 2019-12-25 ENCOUNTER — Encounter: Payer: Self-pay | Admitting: Cardiology

## 2019-12-25 ENCOUNTER — Other Ambulatory Visit: Payer: Self-pay

## 2019-12-25 VITALS — BP 130/88 | HR 76 | Ht 70.0 in | Wt 211.8 lb

## 2019-12-25 DIAGNOSIS — I25119 Atherosclerotic heart disease of native coronary artery with unspecified angina pectoris: Secondary | ICD-10-CM

## 2019-12-25 DIAGNOSIS — Z01810 Encounter for preprocedural cardiovascular examination: Secondary | ICD-10-CM | POA: Diagnosis not present

## 2019-12-25 DIAGNOSIS — R0683 Snoring: Secondary | ICD-10-CM | POA: Diagnosis not present

## 2019-12-25 DIAGNOSIS — R079 Chest pain, unspecified: Secondary | ICD-10-CM
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
## 2019-12-25 NOTE — Patient Instructions (Addendum)
Medication Instructions:   Your physician recommends that you continue on your current medications as directed. Please refer to the Current Medication list given to you today.  Labwork:  Your physician recommends that you return for non-fasting lab work next week for your pre-cath labs. Please have this done at [facility].  You will need a covid test 2-3 days before your heart cath. I will contact you about the date. After your covid test, you will need to go home and quarantine until your heart cath is completed.  Testing/Procedures: Your physician has requested that you have a cardiac catheterization. Cardiac catheterization is used to diagnose and/or treat various heart conditions. Doctors may recommend this procedure for a number of different reasons. The most common reason is to evaluate chest pain. Chest pain can be a symptom of coronary artery disease (CAD), and cardiac catheterization can show whether plaque is narrowing or blocking your heart's arteries. This procedure is also used to evaluate the valves, as well as measure the blood flow and oxygen levels in different parts of your heart. For further information please visit [email or web address]. Please follow instruction sheet, as given.  Follow-Up:  Your physician recommends that you schedule a follow-up appointment in: 6 weeks (office or virtual).  Any Other Special Instructions Will Be Listed Below (If Applicable).  You have been referred to Pulmonology.  If you need a refill on your cardiac medications before your next appointment, please call your pharmacy.     [place] [address] Dept: [phone] Loc: [place]  12/25/2019  You are scheduled for a Cardiac Catheterization on Wednesday, June 2 with Dr. Sherren Mocha.  1. Please arrive at the [facility] (Main  Entrance A) at [facility]: [address] [place], [place] [address] at 8:00 AM (This time is two hours before your procedure to ensure your preparation). Free valet parking service is available.   Special note: Every effort is made to have your procedure done on time. Please understand that emergencies sometimes delay scheduled procedures.  2. Diet: Do not eat solid foods after midnight.  The patient may have clear liquids until 5am upon the day of the procedure.  3. Labs: You will need to have blood drawn on Wednesday, May 26 at [facility] [place]  Open: [place] do not need to be fasting. Covid test appointment to be determined  4. Medication instructions in preparation for your procedure:   Contrast Allergy: No  On the morning of your procedure, take your Aspirin 81 mg and any morning medicines NOT listed above.  You may use sips of water.  5. Plan for one night stay--bring personal belongings. 6. Bring a current list of your medications and current insurance cards. 7. You MUST have a responsible person to drive you home. 8. Someone MUST be with you the first 24 hours after you arrive home or your discharge will be delayed. 9. Please wear clothes that are easy to get on and off and wear slip-on shoes.  Thank you for allowing [place] to care for you!   --  Invasive Cardiovascular services

## 2019-12-25 NOTE — Telephone Encounter (Signed)
Per Branch, give treatment for IV dye allergy protocol.

## 2019-12-25 NOTE — Telephone Encounter (Signed)
Pre-cert Verification for the following procedure    Left heart cath on Wednesday January 20, 2020 @10 :00 am with Dr. [name] dx: chest pain & CAD

## 2019-12-30 MED ORDER — PREDNISONE 50 MG PO TABS: ORAL_TABLET | ORAL | 0 refills | Status: DC

## 2019-12-30 MED ORDER — DIPHENHYDRAMINE HCL 50 MG PO TABS: 50.0000 mg | ORAL_TABLET | Freq: Once | ORAL | 0 refills | Status: DC

## 2019-12-30 NOTE — Telephone Encounter (Signed)
Awaiting call back from [facility] regarding covid test

## 2019-12-31 ENCOUNTER — Telehealth: Payer: Self-pay | Admitting: Cardiology

## 2019-12-31 ENCOUNTER — Emergency Department (HOSPITAL_COMMUNITY): Payer: PPO

## 2019-12-31 ENCOUNTER — Emergency Department (HOSPITAL_COMMUNITY)
Admission: EM | Admit: 2019-12-31 | Discharge: 2019-12-31 | Disposition: A | Discharge status: Home / Self Care | Payer: PPO | Attending: Emergency Medicine | Admitting: Emergency Medicine

## 2019-12-31 ENCOUNTER — Other Ambulatory Visit: Payer: Self-pay

## 2019-12-31 ENCOUNTER — Encounter (HOSPITAL_COMMUNITY): Payer: Self-pay | Admitting: *Deleted

## 2019-12-31 DIAGNOSIS — R1013 Epigastric pain: Secondary | ICD-10-CM | POA: Insufficient documentation

## 2019-12-31 DIAGNOSIS — Z79899 Other long term (current) drug therapy: Secondary | ICD-10-CM | POA: Insufficient documentation

## 2019-12-31 DIAGNOSIS — F17210 Nicotine dependence, cigarettes, uncomplicated: Secondary | ICD-10-CM | POA: Diagnosis not present

## 2019-12-31 DIAGNOSIS — M79602 Pain in left arm: Secondary | ICD-10-CM | POA: Insufficient documentation

## 2019-12-31 DIAGNOSIS — R0789 Other chest pain: Secondary | ICD-10-CM | POA: Diagnosis not present

## 2019-12-31 DIAGNOSIS — R079 Chest pain, unspecified: Secondary | ICD-10-CM | POA: Diagnosis not present

## 2019-12-31 DIAGNOSIS — I2510 Atherosclerotic heart disease of native coronary artery without angina pectoris: Secondary | ICD-10-CM | POA: Insufficient documentation

## 2019-12-31 DIAGNOSIS — R0602 Shortness of breath: Secondary | ICD-10-CM | POA: Diagnosis not present

## 2019-12-31 LAB — BASIC METABOLIC PANEL
Anion gap: 8 (ref 5–15)
BUN: 15 mg/dL (ref 6–20)
CO2: 23 mmol/L (ref 22–32)
Calcium: 8.6 mg/dL — ABNORMAL LOW (ref 8.9–10.3)
Chloride: 105 mmol/L (ref 98–111)
Creatinine, Ser: 1 mg/dL (ref 0.61–1.24)
GFR calc Af Amer: >60 mL/min (ref >60)
GFR calc non Af Amer: >60 mL/min (ref >60)
Glucose, Bld: 105 mg/dL — ABNORMAL HIGH (ref 70–99)
Potassium: 4 mmol/L (ref 3.5–5.1)
Sodium: 136 mmol/L (ref 135–145)

## 2019-12-31 LAB — CBC
HCT: 46.1 % (ref 39.0–52.0)
Hemoglobin: 15.5 g/dL (ref 13.0–17.0)
MCH: 31.1 pg (ref 26.0–34.0)
MCHC: 33.6 g/dL (ref 30.0–36.0)
MCV: 92.4 fL (ref 80.0–100.0)
Platelets: 139 10*3/uL — ABNORMAL LOW (ref 150–400)
RBC: 4.99 MIL/uL (ref 4.22–5.81)
RDW: 13.1 % (ref 11.5–15.5)
WBC: 10.2 10*3/uL (ref 4.0–10.5)
nRBC: 0 % (ref 0.0–0.2)

## 2019-12-31 LAB — TROPONIN I (HIGH SENSITIVITY)
Troponin I (High Sensitivity): 3 ng/L (ref <18)
Troponin I (High Sensitivity): 3 ng/L (ref <18)

## 2019-12-31 MED ORDER — PANTOPRAZOLE SODIUM 20 MG PO TBEC: 20.0000 mg | DELAYED_RELEASE_TABLET | Freq: Every day | ORAL | 0 refills | Status: DC

## 2019-12-31 MED ORDER — NITROGLYCERIN 0.4 MG SL SUBL
0.4000 mg | SUBLINGUAL_TABLET | SUBLINGUAL | Status: DC | PRN
  Administered 2019-12-31: 0.4 mg via SUBLINGUAL
  Filled 2019-12-31: qty 1

## 2019-12-31 MED ORDER — SODIUM CHLORIDE 0.9% FLUSH
3.0000 mL | Freq: Once | INTRAVENOUS | Status: AC
  Administered 2019-12-31: 3 mL via INTRAVENOUS

## 2019-12-31 NOTE — Telephone Encounter (Signed)
Pt c/o of Chest Pain: 1. Are you having CP right now? Not at the moment  2. Are you experiencing any other symptoms (ex. SOB, nausea, vomiting, sweating)? sweating 3. How long have you been experiencing CP? 25 minutes ago 4. Is your CP continuous or coming and going? Continuous  5. Have you taken Nitroglycerin? No    Patient thought he was having heart burn.   7 pain rate to his left arm.

## 2019-12-31 NOTE — ED Triage Notes (Signed)
Pt states this afternoon he started to have center chest pain and is having left arm pain that started yesterday; pt describes the pain as a "rock in my chest"; pt got diaphoretic and had an episode of nausea

## 2019-12-31 NOTE — Telephone Encounter (Signed)
Reports not having active chest pain. Reports that 25 minutes ago, he had chest pain rated 9/10 that felt like severe heartburn. Also reports having sob and dizziness while having chest pain. Does not have nitroglycerin. Has not checked vitals signs. Advised to go to the ED for an evaluation. Patient informed and verbalized understanding of plan.

## 2019-12-31 NOTE — Telephone Encounter (Signed)
Covid test scheduled for 01/19/2020 @9 :20 am at [facility] ([address] [place], [place] [address]). Patient informed and verbalized understanding of plan.
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
## 2019-12-31 NOTE — ED Notes (Signed)
Patient transported to X-ray 

## 2019-12-31 NOTE — Discharge Instructions (Addendum)
Follow-up with your family doctor or your cardiologist next week.  Return if any problems
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
## 2020-01-04 ENCOUNTER — Other Ambulatory Visit: Payer: Self-pay

## 2020-01-04 ENCOUNTER — Ambulatory Visit (INDEPENDENT_AMBULATORY_CARE_PROVIDER_SITE_OTHER): Payer: PPO | Admitting: Family Medicine

## 2020-01-04 ENCOUNTER — Encounter: Payer: Self-pay | Admitting: *Deleted

## 2020-01-04 ENCOUNTER — Telehealth: Payer: Self-pay | Admitting: Family Medicine

## 2020-01-04 ENCOUNTER — Other Ambulatory Visit: Payer: Self-pay | Admitting: Cardiology

## 2020-01-04 ENCOUNTER — Encounter: Payer: Self-pay | Admitting: Family Medicine

## 2020-01-04 VITALS — BP 118/78 | HR 86 | Ht 70.0 in | Wt 210.0 lb

## 2020-01-04 DIAGNOSIS — I10 Essential (primary) hypertension: Secondary | ICD-10-CM | POA: Diagnosis not present

## 2020-01-04 DIAGNOSIS — I2510 Atherosclerotic heart disease of native coronary artery without angina pectoris: Secondary | ICD-10-CM | POA: Diagnosis not present

## 2020-01-04 DIAGNOSIS — R0600 Dyspnea, unspecified: Secondary | ICD-10-CM

## 2020-01-04 DIAGNOSIS — R0602 Shortness of breath: Secondary | ICD-10-CM | POA: Diagnosis not present

## 2020-01-04 DIAGNOSIS — E782 Mixed hyperlipidemia: Secondary | ICD-10-CM

## 2020-01-04 DIAGNOSIS — R079 Chest pain, unspecified: Secondary | ICD-10-CM

## 2020-01-04 DIAGNOSIS — I719 Aortic aneurysm of unspecified site, without rupture: Secondary | ICD-10-CM

## 2020-01-04 DIAGNOSIS — F17200 Nicotine dependence, unspecified, uncomplicated: Secondary | ICD-10-CM | POA: Diagnosis not present

## 2020-01-04 MED ORDER — ISOSORBIDE MONONITRATE ER 30 MG PO TB24: 30.0000 mg | ORAL_TABLET | Freq: Every day | ORAL | 1 refills | Status: DC

## 2020-01-04 NOTE — Patient Instructions (Addendum)
Your physician recommends that you schedule a follow-up appointment in:  [facility] [place] has recommended you make the following change in your medication:   START IMDUR 30 MG DAILY   WE HAVE GIVEN YOU A WORK NOTE   [place] 01/08/2020 ARRIVE AT [place] 9:30AM AT [place] TEST 01/06/20 AT 2:55PM  Your physician has requested that you have an echocardiogram. Echocardiography is a painless test that uses sound waves to create images of your heart. It provides your doctor with information about the size and shape of your heart and how well your heart's chambers and valves are working. This procedure takes approximately one hour. There are no restrictions for this procedure.  Thank you for choosing [facility]!!

## 2020-01-04 NOTE — Telephone Encounter (Signed)
Pre-cert Verification for the following procedure     LEFT HEART CATH & CORONARY ANGIOGRAPHY -DAVID HARDING MD  01/08/2020

## 2020-01-05 ENCOUNTER — Telehealth: Payer: Self-pay | Admitting: Licensed Clinical Social Worker

## 2020-01-05 NOTE — Telephone Encounter (Signed)
CSW received referral from [facility] asking for cost center for [place] office as patient has covid test for procedure on Friday. CSW contacted patient to inquire of other possible options and patient states he is waiting for a friend to return call with a possible ride. Patient will call CSW back this afternoon. Raquel Sarna, [place], [place]

## 2020-01-05 NOTE — Telephone Encounter (Signed)
CSW received call confirming that patient has made alternative transport arrangements for covid testing tomorrow. No further needs for transport at this time. Raquel Sarna, [place], [place]

## 2020-01-06 ENCOUNTER — Other Ambulatory Visit (HOSPITAL_COMMUNITY)
Admission: RE | Admit: 2020-01-06 | Discharge: 2020-01-06 | Disposition: A | Discharge status: Home / Self Care | Payer: PPO | Source: Ambulatory Visit | Attending: Cardiology | Admitting: Cardiology

## 2020-01-06 DIAGNOSIS — Z01812 Encounter for preprocedural laboratory examination: Secondary | ICD-10-CM | POA: Diagnosis not present

## 2020-01-06 DIAGNOSIS — Z20822 Contact with and (suspected) exposure to covid-19: Secondary | ICD-10-CM | POA: Insufficient documentation

## 2020-01-07 ENCOUNTER — Telehealth: Payer: Self-pay | Admitting: *Deleted

## 2020-01-07 LAB — SARS CORONAVIRUS 2 (TAT 6-24 HRS): SARS Coronavirus 2: NEGATIVE

## 2020-01-07 NOTE — Telephone Encounter (Addendum)
Pt contacted pre-catheterization scheduled at [facility] for: Friday Jan 08, 2020 11:30 AM Verified arrival time and place: [facility]) at: 9:30 AM   No solid food after midnight prior to cath, clear liquids until 5 AM day of procedure.  Contrast allergy:  Pt states he is unsure if allergic to IV contrast, or just topical iodine,  states he prefers to take 13 Prednisone and Benadryl Prep to be on safe side, reviewed with patient: Prednisone 50 mg 01/07/20 10:30 PM Prednisone 50 mg 01/08/20 4:30 AM Prednisone 50 mg and benadryl 50 mg 01/08/20 just prior to leaving for hospital AM of procedure    AM meds can be  taken pre-cath with sip of water including: ASA 81 mg   Confirmed patient has responsible adult to drive home post procedure and observe 24 hours after arriving home: yes  You are allowed ONE visitor in the waiting room during your procedure. Both you and your visitor must wear masks.      COVID-19 Pre-Screening Questions:  . In the past 7 to 10 days have you had a cough,  shortness of breath, headache, congestion, fever (100 or greater) body aches, chills, sore throat, or sudden loss of taste or sense of smell? no . Have you been around anyone with known Covid 19 in the past 7 to 10 days? no . Have you been around anyone who is awaiting Covid 19 test results in the past 7 to 10 days? no . Have you been around anyone who has mentioned symptoms of Covid 19 within the past 7 to 10 days? no    Reviewed procedure/mask/visitor instructions,COVID-19 screening questions with patient.

## 2020-01-08 ENCOUNTER — Ambulatory Visit (HOSPITAL_COMMUNITY)
Admission: RE | Admit: 2020-01-08 | Discharge: 2020-01-09 | Disposition: A | Discharge status: Home / Self Care | Payer: PPO | Attending: Cardiology | Admitting: Cardiology

## 2020-01-08 ENCOUNTER — Encounter (HOSPITAL_COMMUNITY)
Admission: RE | Disposition: A | Discharge status: Home / Self Care | Payer: Self-pay | Source: Home / Self Care | Attending: Cardiology

## 2020-01-08 ENCOUNTER — Other Ambulatory Visit: Payer: Self-pay

## 2020-01-08 DIAGNOSIS — I719 Aortic aneurysm of unspecified site, without rupture: Secondary | ICD-10-CM

## 2020-01-08 DIAGNOSIS — Z886 Allergy status to analgesic agent status: Secondary | ICD-10-CM | POA: Insufficient documentation

## 2020-01-08 DIAGNOSIS — E782 Mixed hyperlipidemia: Secondary | ICD-10-CM | POA: Diagnosis not present

## 2020-01-08 DIAGNOSIS — R942 Abnormal results of pulmonary function studies: Secondary | ICD-10-CM

## 2020-01-08 DIAGNOSIS — I10 Essential (primary) hypertension: Secondary | ICD-10-CM | POA: Insufficient documentation

## 2020-01-08 DIAGNOSIS — I25110 Atherosclerotic heart disease of native coronary artery with unstable angina pectoris: Secondary | ICD-10-CM | POA: Diagnosis not present

## 2020-01-08 DIAGNOSIS — Z8249 Family history of ischemic heart disease and other diseases of the circulatory system: Secondary | ICD-10-CM | POA: Diagnosis not present

## 2020-01-08 DIAGNOSIS — G4733 Obstructive sleep apnea (adult) (pediatric): Secondary | ICD-10-CM | POA: Diagnosis not present

## 2020-01-08 DIAGNOSIS — Z888 Allergy status to other drugs, medicaments and biological substances status: Secondary | ICD-10-CM | POA: Diagnosis not present

## 2020-01-08 DIAGNOSIS — E785 Hyperlipidemia, unspecified: Secondary | ICD-10-CM | POA: Diagnosis present

## 2020-01-08 DIAGNOSIS — I200 Unstable angina: Secondary | ICD-10-CM | POA: Diagnosis present

## 2020-01-08 DIAGNOSIS — E876 Hypokalemia: Secondary | ICD-10-CM | POA: Insufficient documentation

## 2020-01-08 DIAGNOSIS — K219 Gastro-esophageal reflux disease without esophagitis: Secondary | ICD-10-CM | POA: Diagnosis not present

## 2020-01-08 DIAGNOSIS — R0609 Other forms of dyspnea: Secondary | ICD-10-CM | POA: Diagnosis not present

## 2020-01-08 DIAGNOSIS — Z79899 Other long term (current) drug therapy: Secondary | ICD-10-CM | POA: Diagnosis not present

## 2020-01-08 DIAGNOSIS — I712 Thoracic aortic aneurysm, without rupture: Secondary | ICD-10-CM | POA: Diagnosis not present

## 2020-01-08 DIAGNOSIS — Z7982 Long term (current) use of aspirin: Secondary | ICD-10-CM | POA: Diagnosis not present

## 2020-01-08 DIAGNOSIS — Z720 Tobacco use: Secondary | ICD-10-CM | POA: Diagnosis present

## 2020-01-08 DIAGNOSIS — R079 Chest pain, unspecified: Secondary | ICD-10-CM

## 2020-01-08 DIAGNOSIS — H548 Legal blindness, as defined in USA: Secondary | ICD-10-CM | POA: Diagnosis not present

## 2020-01-08 DIAGNOSIS — Z7952 Long term (current) use of systemic steroids: Secondary | ICD-10-CM | POA: Diagnosis not present

## 2020-01-08 DIAGNOSIS — Z955 Presence of coronary angioplasty implant and graft: Secondary | ICD-10-CM

## 2020-01-08 DIAGNOSIS — I252 Old myocardial infarction: Secondary | ICD-10-CM | POA: Insufficient documentation

## 2020-01-08 DIAGNOSIS — I2584 Coronary atherosclerosis due to calcified coronary lesion: Secondary | ICD-10-CM | POA: Diagnosis not present

## 2020-01-08 DIAGNOSIS — F17210 Nicotine dependence, cigarettes, uncomplicated: Secondary | ICD-10-CM | POA: Diagnosis not present

## 2020-01-08 HISTORY — PX: LEFT HEART CATH AND CORONARY ANGIOGRAPHY: CATH118249

## 2020-01-08 HISTORY — PX: CORONARY STENT INTERVENTION: CATH118234

## 2020-01-08 LAB — TROPONIN I (HIGH SENSITIVITY)
Troponin I (High Sensitivity): 87 ng/L — ABNORMAL HIGH (ref <18)
Troponin I (High Sensitivity): 106 ng/L (ref <18)
Troponin I (High Sensitivity): 220 ng/L (ref <18)

## 2020-01-08 LAB — POCT ACTIVATED CLOTTING TIME
Activated Clotting Time: 389 seconds
Activated Clotting Time: 439 seconds
Activated Clotting Time: 213 seconds
Activated Clotting Time: 241 seconds

## 2020-01-08 SURGERY — LEFT HEART CATH AND CORONARY ANGIOGRAPHY
Anesthesia: LOCAL

## 2020-01-08 MED ORDER — VERAPAMIL HCL 2.5 MG/ML IV SOLN
INTRAVENOUS | Status: DC | PRN
  Administered 2020-01-08: 10 mL via INTRA_ARTERIAL

## 2020-01-08 MED ORDER — ACETAMINOPHEN 325 MG PO TABS: 650.0000 mg | ORAL_TABLET | ORAL | Status: DC | PRN

## 2020-01-08 MED ORDER — PANTOPRAZOLE SODIUM 20 MG PO TBEC: 20.0000 mg | DELAYED_RELEASE_TABLET | Freq: Every day | ORAL | Status: DC

## 2020-01-08 MED ORDER — CLOPIDOGREL BISULFATE 75 MG PO TABS
75.0000 mg | ORAL_TABLET | Freq: Every day | ORAL | Status: DC
  Administered 2020-01-09: 75 mg via ORAL
  Filled 2020-01-08: qty 1

## 2020-01-08 MED ORDER — MIDAZOLAM HCL 2 MG/2ML IJ SOLN
INTRAMUSCULAR | Status: AC
  Filled 2020-01-08: qty 2

## 2020-01-08 MED ORDER — NITROGLYCERIN IN D5W 200-5 MCG/ML-% IV SOLN: 0.0000 ug/min | INTRAVENOUS | Status: DC

## 2020-01-08 MED ORDER — SODIUM CHLORIDE 0.9 % WEIGHT BASED INFUSION
3.0000 mL/kg/h | INTRAVENOUS | Status: DC
  Administered 2020-01-08: 3 mL/kg/h via INTRAVENOUS

## 2020-01-08 MED ORDER — FUROSEMIDE 10 MG/ML IJ SOLN
INTRAMUSCULAR | Status: AC
  Filled 2020-01-08: qty 4

## 2020-01-08 MED ORDER — SODIUM CHLORIDE 0.9% FLUSH
3.0000 mL | Freq: Two times a day (BID) | INTRAVENOUS | Status: DC
  Administered 2020-01-08: 3 mL via INTRAVENOUS

## 2020-01-08 MED ORDER — SODIUM CHLORIDE 0.9% FLUSH
3.0000 mL | Freq: Two times a day (BID) | INTRAVENOUS | Status: DC
  Administered 2020-01-09: 3 mL via INTRAVENOUS

## 2020-01-08 MED ORDER — MIDAZOLAM HCL 2 MG/2ML IJ SOLN
INTRAMUSCULAR | Status: DC | PRN
  Administered 2020-01-08: 1 mg via INTRAVENOUS
  Administered 2020-01-08: 2 mg via INTRAVENOUS
  Administered 2020-01-08: 1 mg via INTRAVENOUS

## 2020-01-08 MED ORDER — NITROGLYCERIN 1 MG/10 ML FOR IR/CATH LAB
INTRA_ARTERIAL | Status: AC
  Filled 2020-01-08: qty 10

## 2020-01-08 MED ORDER — LOSARTAN POTASSIUM 25 MG PO TABS
12.5000 mg | ORAL_TABLET | Freq: Every day | ORAL | Status: DC
  Administered 2020-01-09: 12.5 mg via ORAL
  Filled 2020-01-08 (×2): qty 1

## 2020-01-08 MED ORDER — ASPIRIN 81 MG PO CHEW: 81.0000 mg | CHEWABLE_TABLET | ORAL | Status: DC

## 2020-01-08 MED ORDER — SODIUM CHLORIDE 0.9 % IV SOLN: 250.0000 mL | INTRAVENOUS | Status: DC | PRN

## 2020-01-08 MED ORDER — HEPARIN SODIUM (PORCINE) 1000 UNIT/ML IJ SOLN
INTRAMUSCULAR | Status: AC
  Filled 2020-01-08: qty 1

## 2020-01-08 MED ORDER — LIDOCAINE HCL (PF) 1 % IJ SOLN
INTRAMUSCULAR | Status: AC
  Filled 2020-01-08: qty 30

## 2020-01-08 MED ORDER — SODIUM CHLORIDE 0.9 % WEIGHT BASED INFUSION
1.0000 mL/kg/h | INTRAVENOUS | Status: AC
  Administered 2020-01-08: 1 mL/kg/h via INTRAVENOUS

## 2020-01-08 MED ORDER — FENTANYL CITRATE (PF) 100 MCG/2ML IJ SOLN
INTRAMUSCULAR | Status: DC | PRN
  Administered 2020-01-08: 25 ug via INTRAVENOUS
  Administered 2020-01-08: 50 ug via INTRAVENOUS
  Administered 2020-01-08 (×2): 25 ug via INTRAVENOUS

## 2020-01-08 MED ORDER — ASPIRIN EC 81 MG PO TBEC
81.0000 mg | DELAYED_RELEASE_TABLET | Freq: Every day | ORAL | Status: DC
  Administered 2020-01-09: 81 mg via ORAL
  Filled 2020-01-08 (×2): qty 1

## 2020-01-08 MED ORDER — SODIUM CHLORIDE 0.9% FLUSH: 3.0000 mL | INTRAVENOUS | Status: DC | PRN

## 2020-01-08 MED ORDER — FENTANYL CITRATE (PF) 100 MCG/2ML IJ SOLN
INTRAMUSCULAR | Status: AC
  Filled 2020-01-08: qty 2

## 2020-01-08 MED ORDER — CLOPIDOGREL BISULFATE 300 MG PO TABS
ORAL_TABLET | ORAL | Status: DC | PRN
  Administered 2020-01-08: 600 mg via ORAL

## 2020-01-08 MED ORDER — HYDRALAZINE HCL 20 MG/ML IJ SOLN: 10.0000 mg | INTRAMUSCULAR | Status: AC | PRN

## 2020-01-08 MED ORDER — VERAPAMIL HCL 2.5 MG/ML IV SOLN
INTRAVENOUS | Status: AC
  Filled 2020-01-08: qty 2

## 2020-01-08 MED ORDER — CLOPIDOGREL BISULFATE 300 MG PO TABS
ORAL_TABLET | ORAL | Status: AC
  Filled 2020-01-08: qty 2

## 2020-01-08 MED ORDER — ONDANSETRON HCL 4 MG/2ML IJ SOLN: 4.0000 mg | Freq: Four times a day (QID) | INTRAMUSCULAR | Status: DC | PRN

## 2020-01-08 MED ORDER — LIDOCAINE HCL (PF) 1 % IJ SOLN
INTRAMUSCULAR | Status: DC | PRN
  Administered 2020-01-08: 2 mL

## 2020-01-08 MED ORDER — NITROGLYCERIN IN D5W 200-5 MCG/ML-% IV SOLN
INTRAVENOUS | Status: AC | PRN
  Administered 2020-01-08: 10 ug/min via INTRAVENOUS

## 2020-01-08 MED ORDER — FAMOTIDINE IN NACL 20-0.9 MG/50ML-% IV SOLN
INTRAVENOUS | Status: AC | PRN
  Administered 2020-01-08: 20 mg via INTRAVENOUS

## 2020-01-08 MED ORDER — LABETALOL HCL 5 MG/ML IV SOLN: 10.0000 mg | INTRAVENOUS | Status: AC | PRN

## 2020-01-08 MED ORDER — NITROGLYCERIN IN D5W 200-5 MCG/ML-% IV SOLN
INTRAVENOUS | Status: AC
  Filled 2020-01-08: qty 250

## 2020-01-08 MED ORDER — ISOSORBIDE MONONITRATE ER 30 MG PO TB24
30.0000 mg | ORAL_TABLET | Freq: Every day | ORAL | Status: DC
  Administered 2020-01-09: 30 mg via ORAL
  Filled 2020-01-08: qty 1

## 2020-01-08 MED ORDER — ALBUTEROL SULFATE (2.5 MG/3ML) 0.083% IN NEBU: 2.5000 mg | INHALATION_SOLUTION | RESPIRATORY_TRACT | Status: DC | PRN

## 2020-01-08 MED ORDER — SODIUM CHLORIDE 0.9 % WEIGHT BASED INFUSION: 1.0000 mL/kg/h | INTRAVENOUS | Status: DC

## 2020-01-08 MED ORDER — HEPARIN (PORCINE) IN NACL 1000-0.9 UT/500ML-% IV SOLN
INTRAVENOUS | Status: DC | PRN
  Administered 2020-01-08 (×2): 500 mL

## 2020-01-08 MED ORDER — DIPHENHYDRAMINE HCL 25 MG PO TABS: 50.0000 mg | ORAL_TABLET | Freq: Once | ORAL | Status: DC

## 2020-01-08 MED ORDER — HEPARIN SODIUM (PORCINE) 1000 UNIT/ML IJ SOLN
INTRAMUSCULAR | Status: DC | PRN
  Administered 2020-01-08 (×2): 5000 [IU] via INTRAVENOUS
  Administered 2020-01-08: 4000 [IU] via INTRAVENOUS

## 2020-01-08 MED ORDER — NITROGLYCERIN 1 MG/10 ML FOR IR/CATH LAB
INTRA_ARTERIAL | Status: DC | PRN
  Administered 2020-01-08 (×3): 200 ug via INTRACORONARY

## 2020-01-08 MED ORDER — FAMOTIDINE IN NACL 20-0.9 MG/50ML-% IV SOLN
INTRAVENOUS | Status: AC
  Filled 2020-01-08: qty 50

## 2020-01-08 MED ORDER — ALBUTEROL SULFATE HFA 108 (90 BASE) MCG/ACT IN AERS: 1.0000 | INHALATION_SPRAY | RESPIRATORY_TRACT | Status: DC | PRN

## 2020-01-08 MED ORDER — FUROSEMIDE 10 MG/ML IJ SOLN
INTRAMUSCULAR | Status: DC | PRN
  Administered 2020-01-08: 40 mg via INTRAVENOUS

## 2020-01-08 MED ORDER — HEPARIN (PORCINE) IN NACL 1000-0.9 UT/500ML-% IV SOLN
INTRAVENOUS | Status: AC
  Filled 2020-01-08: qty 1000

## 2020-01-08 MED ORDER — EZETIMIBE 10 MG PO TABS
10.0000 mg | ORAL_TABLET | Freq: Every day | ORAL | Status: DC
  Administered 2020-01-09: 10 mg via ORAL
  Filled 2020-01-08: qty 1

## 2020-01-08 SURGICAL SUPPLY — 28 items
BALLN SAPPHIRE 2.0X12 (BALLOONS) ×2
BALLN SAPPHIRE 2.5X12 (BALLOONS) ×2
BALLN SAPPHIRE ~~LOC~~ 2.25X8 (BALLOONS) ×2 IMPLANT
BALLN SAPPHIRE ~~LOC~~ 2.5X12 (BALLOONS) ×2 IMPLANT
BALLN ~~LOC~~ EMERGE MR 4.0X20 (BALLOONS) ×2
BALLOON SAPPHIRE 2.0X12 (BALLOONS) ×1 IMPLANT
BALLOON SAPPHIRE 2.5X12 (BALLOONS) ×1 IMPLANT
BALLOON ~~LOC~~ EMERGE MR 4.0X20 (BALLOONS) ×1 IMPLANT
CATH INFINITI JR4 5F (CATHETERS) ×2 IMPLANT
CATH OPTITORQUE TIG 4.0 5F (CATHETERS) ×2 IMPLANT
CATH VISTA GUIDE 6FR XBLAD3.5 (CATHETERS) ×2 IMPLANT
DEVICE RAD COMP TR BAND LRG (VASCULAR PRODUCTS) ×2 IMPLANT
GLIDESHEATH SLEND SS 6F .021 (SHEATH) ×2 IMPLANT
GUIDEWIRE INQWIRE 1.5J.035X260 (WIRE) ×1 IMPLANT
INQWIRE 1.5J .035X260CM (WIRE) ×2
KIT ENCORE 26 ADVANTAGE (KITS) ×2 IMPLANT
KIT HEART LEFT (KITS) ×2 IMPLANT
PACK CARDIAC CATHETERIZATION (CUSTOM PROCEDURE TRAY) ×2 IMPLANT
SHEATH PROBE COVER 6X72 (BAG) ×2 IMPLANT
STENT RESOLUTE ONYX 2.5X18 (Permanent Stent) ×2 IMPLANT
STENT RESOLUTE ONYX 4.0X12 (Permanent Stent) ×2 IMPLANT
STENT RESOLUTE ONYX 4.0X38 (Permanent Stent) ×2 IMPLANT
TRANSDUCER W/STOPCOCK (MISCELLANEOUS) ×2 IMPLANT
TUBING CIL FLEX 10 FLL-RA (TUBING) ×2 IMPLANT
WIRE ASAHI PROWATER 180CM (WIRE) ×2 IMPLANT
WIRE HI TORQ BMW 190CM (WIRE) ×2 IMPLANT
WIRE HI TORQ WHISPER MS 190CM (WIRE) ×2 IMPLANT
WIRE RUNTHROUGH .014X180CM (WIRE) ×2 IMPLANT

## 2020-01-08 NOTE — Interval H&P Note (Signed)
History and Physical Interval Note:  01/08/2020 2:19 PM  Justin Santana Reason  has presented today for surgery, with the diagnosis of Chest pain - Progressive Angina  The various methods of treatment have been discussed with the patient and family. After consideration of risks, benefits and other options for treatment, the patient has consented to  Procedure(s): LEFT HEART CATH AND CORONARY ANGIOGRAPHY (N/A)  PERCUTANEOUS CORONARY INTERVENTION   as a surgical intervention.  The patient's history has been reviewed, patient examined, no change in status, stable for surgery.  I have reviewed the patient's chart and labs.  Questions were answered to the patient's satisfaction.     Glenetta Hew

## 2020-01-09 DIAGNOSIS — F17210 Nicotine dependence, cigarettes, uncomplicated: Secondary | ICD-10-CM | POA: Diagnosis not present

## 2020-01-09 DIAGNOSIS — Z955 Presence of coronary angioplasty implant and graft: Secondary | ICD-10-CM

## 2020-01-09 DIAGNOSIS — R0609 Other forms of dyspnea: Secondary | ICD-10-CM | POA: Diagnosis not present

## 2020-01-09 DIAGNOSIS — I2584 Coronary atherosclerosis due to calcified coronary lesion: Secondary | ICD-10-CM | POA: Diagnosis not present

## 2020-01-09 DIAGNOSIS — E782 Mixed hyperlipidemia: Secondary | ICD-10-CM | POA: Diagnosis not present

## 2020-01-09 DIAGNOSIS — E876 Hypokalemia: Secondary | ICD-10-CM | POA: Diagnosis not present

## 2020-01-09 DIAGNOSIS — I712 Thoracic aortic aneurysm, without rupture: Secondary | ICD-10-CM | POA: Diagnosis not present

## 2020-01-09 DIAGNOSIS — I719 Aortic aneurysm of unspecified site, without rupture: Secondary | ICD-10-CM

## 2020-01-09 DIAGNOSIS — K219 Gastro-esophageal reflux disease without esophagitis: Secondary | ICD-10-CM | POA: Diagnosis not present

## 2020-01-09 DIAGNOSIS — I25110 Atherosclerotic heart disease of native coronary artery with unstable angina pectoris: Secondary | ICD-10-CM | POA: Diagnosis not present

## 2020-01-09 DIAGNOSIS — I200 Unstable angina: Secondary | ICD-10-CM | POA: Diagnosis not present

## 2020-01-09 DIAGNOSIS — I10 Essential (primary) hypertension: Secondary | ICD-10-CM | POA: Diagnosis not present

## 2020-01-09 DIAGNOSIS — G4733 Obstructive sleep apnea (adult) (pediatric): Secondary | ICD-10-CM | POA: Diagnosis not present

## 2020-01-09 DIAGNOSIS — H548 Legal blindness, as defined in USA: Secondary | ICD-10-CM | POA: Diagnosis not present

## 2020-01-09 DIAGNOSIS — R942 Abnormal results of pulmonary function studies: Secondary | ICD-10-CM

## 2020-01-09 DIAGNOSIS — I252 Old myocardial infarction: Secondary | ICD-10-CM | POA: Diagnosis not present

## 2020-01-09 LAB — CBC
HCT: 39.8 % (ref 39.0–52.0)
Hemoglobin: 13.2 g/dL (ref 13.0–17.0)
MCH: 30.5 pg (ref 26.0–34.0)
MCHC: 33.2 g/dL (ref 30.0–36.0)
MCV: 91.9 fL (ref 80.0–100.0)
Platelets: 129 10*3/uL — ABNORMAL LOW (ref 150–400)
RBC: 4.33 MIL/uL (ref 4.22–5.81)
RDW: 12.6 % (ref 11.5–15.5)
WBC: 16.3 10*3/uL — ABNORMAL HIGH (ref 4.0–10.5)
nRBC: 0 % (ref 0.0–0.2)

## 2020-01-09 LAB — BASIC METABOLIC PANEL
Anion gap: 12 (ref 5–15)
BUN: 16 mg/dL (ref 6–20)
CO2: 21 mmol/L — ABNORMAL LOW (ref 22–32)
Calcium: 7.9 mg/dL — ABNORMAL LOW (ref 8.9–10.3)
Chloride: 106 mmol/L (ref 98–111)
Creatinine, Ser: 1.12 mg/dL (ref 0.61–1.24)
GFR calc Af Amer: >60 mL/min (ref >60)
GFR calc non Af Amer: >60 mL/min (ref >60)
Glucose, Bld: 162 mg/dL — ABNORMAL HIGH (ref 70–99)
Potassium: 3.4 mmol/L — ABNORMAL LOW (ref 3.5–5.1)
Sodium: 139 mmol/L (ref 135–145)

## 2020-01-09 LAB — TROPONIN I (HIGH SENSITIVITY): Troponin I (High Sensitivity): 316 ng/L (ref <18)

## 2020-01-09 MED ORDER — CLOPIDOGREL BISULFATE 75 MG PO TABS: 75.0000 mg | ORAL_TABLET | Freq: Every day | ORAL | 3 refills | Status: DC

## 2020-01-09 MED ORDER — NITROGLYCERIN 0.4 MG SL SUBL
0.4000 mg | SUBLINGUAL_TABLET | SUBLINGUAL | 3 refills | Status: DC | PRN
Start: 2020-01-09 — End: 2020-01-25

## 2020-01-09 MED ORDER — POTASSIUM CHLORIDE CRYS ER 20 MEQ PO TBCR
40.0000 meq | EXTENDED_RELEASE_TABLET | Freq: Once | ORAL | Status: AC
  Administered 2020-01-09: 40 meq via ORAL
  Filled 2020-01-09: qty 2

## 2020-01-09 NOTE — Discharge Summary (Signed)
Discharge Summary    Patient ID: Justin Santana MRN: [id number]; DOB: 1965-11-20  Admit date: 01/08/2020 Discharge date: 01/09/2020  Primary Care Provider: [name], MD  Primary Cardiologist: [name], MD  Primary Electrophysiologist:  None   Discharge Diagnoses    Principal Problem:   Progressive angina [facility]) Active Problems:   Tobacco abuse   Coronary artery disease involving native coronary artery of native heart with unstable angina pectoris ([place])   Hyperlipidemia with target LDL less than 70   Essential hypertension   Status post coronary artery stent placement   Abnormal PFTs   Hypokalemia   Aortic aneurysm without rupture [facility])    Diagnostic Studies/Procedures    Left heart catheterization 01/08/20:  1st Diag lesion is 95% stenosed.  A drug-eluting stent was successfully placed using a STENT RESOLUTE ONYX 2.5X18.  Post intervention, there is a 0% residual stenosis.  ----------------------  Culprit lesion mid LAD lesion is 95% stenosed. Leading up to this lesion, prox LAD to Mid LAD lesion is 70% stenosed with 40% stenosed side branch in 1st Diag.  Post intervention, there is a 0% residual stenosis.  2 overlapping drug-eluting stents was successfully placed covering the entire lesion segment, using STENT RESOLUTE ONYX 4.0X38 & STENT RESOLUTE ONYX 4.0X12. STENT RESOLUTE ONYX 4.0X12. -Postdilated to 4.2 mm)  Post intervention, there is a 0% residual stenosis.  Post intervention, the side branch was worsened to 70% residual stenosis. -Plaque shift after final stent post dilation led to TIMI I flow. After IC nitroglycerin, there is TIMI-2 flow. Was not able to rewire the diagonal.  ------------------------------  Previously placed Mid Cx (3rdOM) drug-eluting stent is widely patent.  RCA with PDA and PLB relatively normal.  ---------------------------------  The left ventricular systolic function is normal. The left ventricular ejection fraction  is 55-65% by visual estimate.  Prox LAD to Mid LAD lesion is 70% stenosed with 40% stenosed side branch in 1st Diag.   SUMMARY  Three-vessel CAD: Significant progression of ostial-mid LAD lesion with napkin ring 95% stenosis at the end of the lesion, proximal 1st Diag 95%, widely patent LCx stent ? Successful DES stenting of the ostial to mid LAD using 2 overlapping RESOLUTE ONYX DES STENTS (4.0 mm x 38 mm, 4.0 mm x 12 mm--postdilated to 4.2 mm) ? Successful DES PCI of 1st Diag branch that was jailed by the LAD stents leading to TIMI II flow post PCI.  Unable to rewire.  Normal LV function and EDP   Recommend overnight monitoring.  Would run nitroglycerin overnight.  Have checked a troponin levels based on patient's significant symptoms and slow flow in the diagonal branch.  Low threshold to continue with 1 more day inpatient to monitor if the troponin levels are elevated.  Patient is aware of this plan.  He was chest pain-free and there was no ischemic changes on the EKG upon leaving the Cath Lab. _____________   History of Present Illness     Justin Santana is a 54 y.o. male with a history of CAD, HLD, aortic aneurysm, leg cramps, abnormal PFTs, Aortic aneurysm (mild via echo). 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 Staged procedure was recommended though deferred d/t social issues at home. 10/2013 MPI low risk inferior scar vs subdiaphragmatic attenuation. 12/2015 MPI no ischemia.  01/2018 MPI no ischemia. He was seen outpatient by Katina Dung, NP 01/04/20 for post-hospital follow-up of recent chest pain. At the time of  his visit he had complaints of SOB w/wo activity. He was previously scheduled for a cardiac cath 01/20/20, though given worsening symptoms his cardiac catheterization was expedited.  Hospital Course     Consultants: None   1. CAD: patient presented with progressive SOB and recent chest pain. Scheduled for an  outpatient cardiac catheterization. Cath 01/08/20 showed multivessel CAD with 95% 1st diagonal stenosis s/p PCI/DES, 95% mLAD stenosis s/p 2 overlapping stents resulting in plaque shift with 70% residual 1st diagonal stenosis, and patent LCx stent. He was started on aspirin and plavix for a minimum of 1 year. Not on a BBlocker due to fatigue.  - Continue aspirin and plavix - Continue statin - Continue imdur - Outpatient echo is scheduled for 02/03/20  2. HTN: BP stable this admission - Continue losartan  3. HLD: intolerant to statins. No recent lipids on file but historically LDL is >130.  - Continue zetia - Will place a referral to the lipid clinic for PSK9-I consideration  4. Ascending aortic aneurysm: mild ascending aortic aneurysm on echo in 2019. Appears he is ordered for an echo 02/03/2020  5. GERD: omeprazole transitioned to pantoprazole given need for plavix - Continue pantoprazole.    Did the patient have an acute coronary syndrome (MI, NSTEMI, STEMI, etc) this admission?:  No                               Did the patient have a percutaneous coronary intervention (stent / angioplasty)?:  Yes.     Cath/PCI Registry Performance & Quality Measures: 3. Aspirin prescribed? - Yes 4. ADP Receptor Inhibitor (Plavix/Clopidogrel, Brilinta/Ticagrelor or Effient/Prasugrel) prescribed (includes medically managed patients)? - Yes 5. High Intensity Statin (Lipitor 40-80mg  or Crestor 20-40mg ) prescribed? - No - intolerant to statins 6. For EF <40%, was ACEI/ARB prescribed? - Yes 7. For EF <40%, Aldosterone Antagonist (Spironolactone or Eplerenone) prescribed? - Not Applicable (EF >/= [id number]) 8. Cardiac Rehab Phase II ordered? - Yes   _____________  Discharge Vitals Blood pressure 114/76, pulse 74, temperature 97.8 F (36.6 C), temperature source Oral, resp. rate (!) 27, height 5\' 10"  (1.778 m), weight 95.2 kg, SpO2 95 %.  Filed Weights   01/08/20 0849 01/09/20 0659  Weight: 95.3 kg 95.2 kg      Labs & Radiologic Studies    CBC Recent Labs    01/09/20 0042  WBC 16.3*  HGB 13.2  HCT 39.8  MCV 91.9  PLT [id number]*   Basic Metabolic Panel Recent Labs    01/09/20 0042  NA 139  K 3.4*  CL 106  CO2 21*  GLUCOSE 162*  BUN 16  CREATININE 1.12  CALCIUM 7.9*   Liver Function Tests No results for input(s): AST, ALT, ALKPHOS, BILITOT, PROT, ALBUMIN in the last 72 hours. No results for input(s): LIPASE, AMYLASE in the last 72 hours. High Sensitivity Troponin:   Recent Labs  Lab 12/31/19 1854 01/08/20 1848 01/08/20 2049 01/08/20 2247 01/09/20 0042  TROPONINIHS 3 87* 106* 220* 316*    BNP Invalid input(s): POCBNP D-Dimer No results for input(s): DDIMER in the last 72 hours. Hemoglobin A1C No results for input(s): HGBA1C in the last 72 hours. Fasting Lipid Panel No results for input(s): CHOL, HDL, LDLCALC, TRIG, CHOLHDL, LDLDIRECT in the last 72 hours. Thyroid Function Tests No results for input(s): TSH, T4TOTAL, T3FREE, THYROIDAB in the last 72 hours.  Invalid input(s): FREET3 _____________  DG Chest 2 View  Result Date:  12/31/2019 CLINICAL DATA:  Chest pain and shortness of breath EXAM: CHEST - 2 VIEW COMPARISON:  CT from 12/04/2019 FINDINGS: Cardiac shadow is within normal limits. Lungs are well aerated bilaterally. Known small pulmonary nodules are not well appreciated on this exam. No focal infiltrate or sizable effusion is seen. No bony abnormality is noted. IMPRESSION: No acute abnormality seen. Electronically Signed   By: Inez Catalina M.D.   On: 12/31/2019 17:09   CARDIAC CATHETERIZATION  Result Date: 01/08/2020  1st Diag lesion is 95% stenosed.  A drug-eluting stent was successfully placed using a STENT RESOLUTE ONYX 2.5X18.  Post intervention, there is a 0% residual stenosis.  ----------------------  Culprit lesion mid LAD lesion is 95% stenosed. Leading up to this lesion, prox LAD to Mid LAD lesion is 70% stenosed with 40% stenosed side branch in 1st  Diag.  Post intervention, there is a 0% residual stenosis.  2 overlapping drug-eluting stents was successfully placed covering the entire lesion segment, using STENT RESOLUTE ONYX 4.0X38 & STENT RESOLUTE ONYX 4.0X12. STENT RESOLUTE ONYX 4.0X12. -Postdilated to 4.2 mm)  Post intervention, there is a 0% residual stenosis.  Post intervention, the side branch was worsened to 70% residual stenosis. -Plaque shift after final stent post dilation led to TIMI I flow. After IC nitroglycerin, there is TIMI-2 flow. Was not able to rewire the diagonal.  ------------------------------  Previously placed Mid Cx (3rdOM) drug-eluting stent is widely patent.  RCA with PDA and PLB relatively normal.  ---------------------------------  The left ventricular systolic function is normal. The left ventricular ejection fraction is 55-65% by visual estimate.  Prox LAD to Mid LAD lesion is 70% stenosed with 40% stenosed side branch in 1st Diag.  SUMMARY  Three-vessel CAD: Significant progression of ostial-mid LAD lesion with napkin ring 95% stenosis at the end of the lesion, proximal 1st Diag 95%, widely patent LCx stent  Successful DES stenting of the ostial to mid LAD using 2 overlapping RESOLUTE ONYX DES STENTS (4.0 mm x 38 mm, 4.0 mm x 12 mm--postdilated to 4.2 mm)  Successful DES PCI of 1st Diag branch that was jailed by the LAD stents leading to TIMI II flow post PCI.  Unable to rewire.  Normal LV function and EDP Recommend overnight monitoring.  Would run nitroglycerin overnight.  Have checked a troponin levels based on patient's significant symptoms and slow flow in the diagonal branch.  Low threshold to continue with 1 more day inpatient to monitor if the troponin levels are elevated.  Patient is aware of this plan. He was chest pain-free and there was no ischemic changes on the EKG upon leaving the Cath Lab. Glenetta Hew, MD  Disposition   Pt is being discharged home today in good condition.  Follow-up Plans &  Appointments    Follow-up Information    Branch, Alphonse Guild, MD Follow up.   Specialty: Cardiology Why: Our office will contact you with an appointment date/time for close outpatient follow-up.  Contact information: [address] [place] [address] [phone]          Discharge Instructions    AMB Referral to Advanced Lipid Disorders Clinic   Complete by: As directed    Reason for referral: Patients with statin intolerance (failed 2 statins, one of which must be a high potency statin)   Provider to see patient: PharmD   Internal Lipid Clinic Referral Scheduling  Internal lipid clinic referrals are providers within [facility], who wish to refer established patients for routine management (help  in starting PCSK9 inhibitor therapy) or advanced therapies.  Internal MD referral criteria:              1. All patients with LDL>190 mg/dL  2. All patients with Triglycerides >500 mg/dL  3. Patients with suspected or confirmed heterozygous familial hyperlipidemia (HeFH) or homozygous familial hyperlipidemia (HoFH)  4. Patients with family history of suspicious for genetic dyslipidemia desiring genetic testing  5. Patients refractory to standard guideline based therapy  6. Patients with statin intolerance (failed 2 statins, one of which must be a high potency statin)  7. Patients who the provider desires to be seen by MD   Internal PharmD referral criteria:   1. Follow-up patients for medication management  2. Follow-up for compliance monitoring  3. Patients for drug education  4. Patients with statin intolerance  5. PCSK9 inhibitor education and prior authorization approvals  6. Patients with triglycerides <500 mg/dL  External Lipid Clinic Referral  External lipid clinic referrals are for providers outside of [facility], considered new clinic patients - automatically routed to MD schedule   Amb Referral to Cardiac Rehabilitation   Complete by: As directed    Diagnosis:  Coronary Stents   After initial evaluation and assessments completed: Virtual Based Care may be provided alone or in conjunction with Phase 2 Cardiac Rehab based on patient barriers.: Yes   Diet - low sodium heart healthy   Complete by: As directed    Diet - low sodium heart healthy   Complete by: As directed    Increase activity slowly   Complete by: As directed    Increase activity slowly   Complete by: As directed       Discharge Medications   Allergies as of 01/09/2020      Reactions   Aleve [naproxen Sodium] Shortness Of Breath, Other (See Comments)   Sweating, difficulty breathing   Iodine Other (See Comments)   Blistering of skin (topical iodine/betadine)   Statins Other (See Comments)   Severe Myopathy, joint pains      Medication List    STOP taking these medications   predniSONE 50 MG tablet Commonly known as: DELTASONE     TAKE these medications   albuterol 108 (90 Base) MCG/ACT inhaler Commonly known as: VENTOLIN HFA Inhale 1-2 puffs into the lungs every 4 (four) hours as needed.   aspirin 81 MG EC tablet Take 1 tablet (81 mg total) by mouth daily.   clopidogrel 75 MG tablet Commonly known as: PLAVIX Take 1 tablet (75 mg total) by mouth daily with breakfast. Start taking on: Jan 10, 2020   diphenhydrAMINE 50 MG tablet Commonly known as: BENADRYL Take 1 tablet (50 mg total) by mouth once for 1 dose. 1-2 hours before your procedure   ezetimibe 10 MG tablet Commonly known as: ZETIA Take 1 tablet (10 mg total) by mouth daily.   isosorbide mononitrate 30 MG 24 hr tablet Commonly known as: IMDUR Take 1 tablet (30 mg total) by mouth daily.   losartan 25 MG tablet Commonly known as: COZAAR TAKE 1/2 TABLET BY MOUTH EVERY DAY (PLEASE CUT IN HALF FOR PT) What changed: See the new instructions.   nitroGLYCERIN 0.4 MG SL tablet Commonly known as: Nitrostat Place 1 tablet (0.4 mg total) under the tongue every 5 (five) minutes as needed for chest pain.     pantoprazole 20 MG tablet Commonly known as: PROTONIX Take 1 tablet (20 mg total) by mouth daily.   triamcinolone ointment 0.1 % Commonly known  as: KENALOG Apply 1 application topically 3 (three) times daily as needed.          Outstanding Labs/Studies   None  Duration of Discharge Encounter   Greater than 30 minutes including physician time.  Signed, Abigail Butts, PA-C 01/09/2020, 9:34 PM

## 2020-01-09 NOTE — Progress Notes (Signed)
Progress Note  Patient Name: Justin Santana Date of Encounter: 01/09/2020  Primary Cardiologist: [name], MD   Subjective   Denies chest pain or shortness of breath.  Eager to go home.  He is legally blind and has a ride coming.  Inpatient Medications    Scheduled Meds: . aspirin EC  81 mg Oral Daily  . clopidogrel  75 mg Oral Q breakfast  . ezetimibe  10 mg Oral Daily  . isosorbide mononitrate  30 mg Oral Daily  . losartan  12.5 mg Oral Daily  . sodium chloride flush  3 mL Intravenous Q12H  . sodium chloride flush  3 mL Intravenous Q12H   Continuous Infusions: . sodium chloride    . nitroGLYCERIN 5 mcg/min (01/08/20 1915)   PRN Meds: sodium chloride, acetaminophen, albuterol, ondansetron (ZOFRAN) IV, sodium chloride flush   Vital Signs    Vitals:   01/08/20 1958 01/09/20 0001 01/09/20 0659 01/09/20 0833  BP: 115/69 107/63 113/71 121/79  Pulse: 89 78 72 71  Resp: (!) 22 (!) 22 19 (!) 26  Temp: 97.6 F (36.4 C) 97.8 F (36.6 C) (!) 97.5 F (36.4 C) 97.8 F (36.6 C)  TempSrc: Oral Oral Oral Oral  SpO2: 96% 96% 97% 97%  Weight:   95.2 kg   Height:        Intake/Output Summary (Last 24 hours) at 01/09/2020 1134 Last data filed at 01/08/2020 1930 Gross per 24 hour  Intake --  Output 1425 ml  Net -1425 ml   Last 3 Weights 01/09/2020 01/08/2020 01/04/2020  Weight (lbs) 209 lb 14.1 oz 210 lb 210 lb  Weight (kg) 95.2 kg 95.255 kg 95.255 kg      Telemetry    Sinus rhythm with PVCs- Personally Reviewed  ECG    Sinus rhythm with no ischemic ST segment or T wave abnormalities, nor any arrhythmias- Personally Reviewed  Physical Exam   GEN: No acute distress.   Neck: No JVD Cardiac: RRR, no murmurs, rubs, or gallops.  Respiratory:  Diminished throughout without crackles or wheezes. GI: Soft, nontender, non-distended  MS: No edema; No deformity. Neuro:  Nonfocal  Psych: Normal affect   Labs    High Sensitivity Troponin:   Recent Labs  Lab  12/31/19 1854 01/08/20 1848 01/08/20 2049 01/08/20 2247 01/09/20 0042  TROPONINIHS 3 87* 106* 220* 316*      Chemistry Recent Labs  Lab 01/09/20 0042  NA 139  K 3.4*  CL 106  CO2 21*  GLUCOSE 162*  BUN 16  CREATININE 1.12  CALCIUM 7.9*  GFRNONAA >60  GFRAA >60  ANIONGAP 12     Hematology Recent Labs  Lab 01/09/20 0042  WBC 16.3*  RBC 4.33  HGB 13.2  HCT 39.8  MCV 91.9  MCH 30.5  MCHC 33.2  RDW 12.6  PLT 129*    BNPNo results for input(s): BNP, PROBNP in the last 168 hours.   DDimer No results for input(s): DDIMER in the last 168 hours.   Radiology    CARDIAC CATHETERIZATION  Result Date: 01/08/2020  1st Diag lesion is 95% stenosed.  A drug-eluting stent was successfully placed using a STENT RESOLUTE ONYX 2.5X18.  Post intervention, there is a 0% residual stenosis.  ----------------------  Culprit lesion mid LAD lesion is 95% stenosed. Leading up to this lesion, prox LAD to Mid LAD lesion is 70% stenosed with 40% stenosed side branch in 1st Diag.  Post intervention, there is a 0% residual stenosis.  2 overlapping  drug-eluting stents was successfully placed covering the entire lesion segment, using STENT RESOLUTE ONYX 4.0X38 & STENT RESOLUTE ONYX 4.0X12. STENT RESOLUTE ONYX 4.0X12. -Postdilated to 4.2 mm)  Post intervention, there is a 0% residual stenosis.  Post intervention, the side branch was worsened to 70% residual stenosis. -Plaque shift after final stent post dilation led to TIMI I flow. After IC nitroglycerin, there is TIMI-2 flow. Was not able to rewire the diagonal.  ------------------------------  Previously placed Mid Cx (3rdOM) drug-eluting stent is widely patent.  RCA with PDA and PLB relatively normal.  ---------------------------------  The left ventricular systolic function is normal. The left ventricular ejection fraction is 55-65% by visual estimate.  Prox LAD to Mid LAD lesion is 70% stenosed with 40% stenosed side branch in 1st Diag.   SUMMARY  Three-vessel CAD: Significant progression of ostial-mid LAD lesion with napkin ring 95% stenosis at the end of the lesion, proximal 1st Diag 95%, widely patent LCx stent  Successful DES stenting of the ostial to mid LAD using 2 overlapping RESOLUTE ONYX DES STENTS (4.0 mm x 38 mm, 4.0 mm x 12 mm--postdilated to 4.2 mm)  Successful DES PCI of 1st Diag branch that was jailed by the LAD stents leading to TIMI II flow post PCI.  Unable to rewire.  Normal LV function and EDP Recommend overnight monitoring.  Would run nitroglycerin overnight.  Have checked a troponin levels based on patient's significant symptoms and slow flow in the diagonal branch.  Low threshold to continue with 1 more day inpatient to monitor if the troponin levels are elevated.  Patient is aware of this plan. He was chest pain-free and there was no ischemic changes on the EKG upon leaving the Cath Lab. Glenetta Hew, MD   Cardiac Studies   Cardiac catheterization 01/08/2020:   1st Diag lesion is 95% stenosed.  A drug-eluting stent was successfully placed using a STENT RESOLUTE ONYX 2.5X18.  Post intervention, there is a 0% residual stenosis.  ----------------------  Culprit lesion mid LAD lesion is 95% stenosed. Leading up to this lesion, prox LAD to Mid LAD lesion is 70% stenosed with 40% stenosed side branch in 1st Diag.  Post intervention, there is a 0% residual stenosis.  2 overlapping drug-eluting stents was successfully placed covering the entire lesion segment, using STENT RESOLUTE ONYX 4.0X38 & STENT RESOLUTE ONYX 4.0X12. STENT RESOLUTE ONYX 4.0X12. -Postdilated to 4.2 mm)  Post intervention, there is a 0% residual stenosis.  Post intervention, the side branch was worsened to 70% residual stenosis. -Plaque shift after final stent post dilation led to TIMI I flow. After IC nitroglycerin, there is TIMI-2 flow. Was not able to rewire the diagonal.  ------------------------------  Previously placed Mid Cx  (3rdOM) drug-eluting stent is widely patent.  RCA with PDA and PLB relatively normal.  ---------------------------------  The left ventricular systolic function is normal. The left ventricular ejection fraction is 55-65% by visual estimate.  Prox LAD to Mid LAD lesion is 70% stenosed with 40% stenosed side branch in 1st Diag.   SUMMARY  Three-vessel CAD: Significant progression of ostial-mid LAD lesion with napkin ring 95% stenosis at the end of the lesion, proximal 1st Diag 95%, widely patent LCx stent ? Successful DES stenting of the ostial to mid LAD using 2 overlapping RESOLUTE ONYX DES STENTS (4.0 mm x 38 mm, 4.0 mm x 12 mm--postdilated to 4.2 mm) ? Successful DES PCI of 1st Diag branch that was jailed by the LAD stents leading to TIMI II flow post  PCI.  Unable to rewire.  Normal LV function and EDP   Recommend overnight monitoring.  Would run nitroglycerin overnight.  Have checked a troponin levels based on patient's significant symptoms and slow flow in the diagonal branch.  Low threshold to continue with 1 more day inpatient to monitor if the troponin levels are elevated.  Patient is aware of this plan.  He was chest pain-free and there was no ischemic changes on the EKG upon leaving the Cath Lab.  Patient Profile     54 y.o. male with coronary disease, hyperlipidemia, aortic aneurysm has been experiencing progressive exertional dyspnea and chest pain who presents for cardiac catheterization.  Assessment & Plan    1.  Coronary artery disease: Status post drug-eluting stenting of the ostial to mid LAD using 2 overlapping stents and drug-eluting stent placement of the first diagonal branch that was jailed by the LAD stents leading to TIMI II flow post PCI.  LV function and EDP were normal.  Continue dual antiplatelet therapy with aspirin and clopidogrel for a minimum of 1 year if not longer.  Continue Imdur and Zetia. Troponins 87, 106, 220, 316 most recently.  Denies anginal  pain altogether.  2.  Hypertension: BP is normal.  No change to therapy.  3.  Hyperlipidemia: Currently on Zetia.  Statin intolerant.  4.  Aortic aneurysm: Mild by echocardiogram.  5.  Abnormal PFTs: Previously followed by Dr. Luan Pulling.  6.  Hypokalemia: I will replace today.    Disposition: We will discharge to home today.  For questions or updates, please contact [facility] Please consult [email or web address] for contact info under        Signed, Kate Sable, MD  01/09/2020, 11:34 AM

## 2020-01-09 NOTE — Discharge Instructions (Signed)
PLEASE REMEMBER TO BRING ALL OF YOUR MEDICATIONS TO EACH OF YOUR FOLLOW-UP OFFICE VISITS.  PLEASE ATTEND ALL SCHEDULED FOLLOW-UP APPOINTMENTS.   Activity: Increase activity slowly as tolerated. You may shower, but no soaking baths (or swimming) for 1 week. No driving for 24 hours. No lifting over 5 lbs for 1 week. No sexual activity for 1 week.   You May Return to Work: in 1 week (if applicable)  Wound Care: You may wash cath site gently with soap and water. Keep cath site clean and dry. If you notice pain, swelling, bleeding or pus at your cath site, please call [phone].    Please be sure to take your aspirin and plavix without missing doses. Skipping doses puts you at risk of blocking your stent which can result in a heart attack.

## 2020-01-09 NOTE — Progress Notes (Signed)
CARDIAC REHAB PHASE I   PRE:  Rate/Rhythm: 73 NSR    BP: sitting 130/83    SaO2: 98 RA  MODE:  Ambulation: 400 ft   POST:  Rate/Rhythm: 88 during ambulation, 66 post NSR    BP: sitting 138/85     SaO2: 97 RA  [id number] Patient ambulated in hallway independently pushing IV pole. Denied complaints. Steady gait noted. Post ambulation patient to chair with call bell and phone in reach. D/C education completed verbally including anti-platelet therapy, risk factors, exercise guidelines, angina/NTG, and restrictions. Additional focus placed on smoking cessation and educational materials give. Of note patient is legally blind but has tools at home that allow him to read fine print. Patient agrees to review written education material with daughter and girlfriend upon return home. Patient states transportation is a barrier to participation in [facility] however request referral to be placed to [facility] phase 2 CR incase transportation barriers improve. Referral placed.  Hallee Mckenny [name] RN, BSN

## 2020-01-11 ENCOUNTER — Telehealth: Payer: Self-pay | Admitting: Cardiology

## 2020-01-11 ENCOUNTER — Encounter: Payer: Self-pay | Admitting: *Deleted

## 2020-01-11 MED ORDER — PANTOPRAZOLE SODIUM 40 MG PO TBEC
40.0000 mg | DELAYED_RELEASE_TABLET | Freq: Every day | ORAL | 1 refills | Status: DC
Start: 2020-01-11 — End: 2020-06-06

## 2020-01-11 MED ORDER — ISOSORBIDE MONONITRATE ER 30 MG PO TB24
45.0000 mg | ORAL_TABLET | Freq: Every day | ORAL | 1 refills | Status: DC
Start: 2020-01-11 — End: 2020-06-22

## 2020-01-11 NOTE — Telephone Encounter (Signed)
Per Dr. [name], increase his imdur to 45mg  daily, increase protonix to 40mg  daily, needs a work note excusing from 2 weeks from today. Can we move his f/u with me to Mon June 7 at 920 AM  Patient informed and verbalized understanding of plan. Letter available in [facility]

## 2020-01-11 NOTE — Telephone Encounter (Signed)
Patient had Cath this past Friday. Wanted to discuss and ask questions.

## 2020-01-11 NOTE — Telephone Encounter (Signed)
Patient contacted and says he is upset that he hasn't been contacted by his cardiologist after having 4 stents placed during heart cath. Explained to patient that follow up calls aren't done after heart caths unless specific instructions or plans are sent to clinical staff to follow up with patient. Advised patient that he does have follow up appointment with [facility] on 02/04/20. Advised that his d/c instructions explains should explain the plan as well as home follow up care. Patient is requesting a call back from Dr. [name] to discuss the findings from his heart cath.

## 2020-01-14 ENCOUNTER — Ambulatory Visit: Payer: PPO

## 2020-01-14 ENCOUNTER — Ambulatory Visit (INDEPENDENT_AMBULATORY_CARE_PROVIDER_SITE_OTHER): Payer: PPO | Admitting: Pharmacist

## 2020-01-14 ENCOUNTER — Other Ambulatory Visit: Payer: Self-pay

## 2020-01-14 VITALS — BP 124/80 | HR 82 | Ht 70.0 in | Wt 210.4 lb

## 2020-01-14 DIAGNOSIS — E782 Mixed hyperlipidemia: Secondary | ICD-10-CM | POA: Diagnosis not present

## 2020-01-14 DIAGNOSIS — E785 Hyperlipidemia, unspecified: Secondary | ICD-10-CM

## 2020-01-14 MED ORDER — NEXLIZET 180-10 MG PO TABS: 1.0000 | ORAL_TABLET | Freq: Every day | ORAL | 0 refills | Status: DC

## 2020-01-14 NOTE — Progress Notes (Signed)
Patient ID: Justin Santana                 DOB: February 29, 1966                    MRN: [id number]     HPI: Elven Groome is a 54 y.o. male patient of Dr [name] referred to lipid clinic by Lawanda Cousins NP. PMH is significant for progressive angina, tobacco abuse, hypertension, hypokalemia, aortic aneurysm without rupture, and hx of hypokalemia. He has a physically demanding job where he walks close to 10 miles per day and reports strong family history for heart disease. Reports intolerance to atorvastatin and pravastatin in the past. Statins caused severe body aches and made his job very difficult. Patient reports compliance with ezetimibe 10mg  daily and is currently working on positive diet changes.   Current Medications:  Atorvastatin 80mg  daily - severe generalized body ache Pravastatin 20mg  daily - body aches  Intolerances:  Ezetimibe 10mg  daily  LDL goal: < 70mg /dL  Diet: moving towards healthier diet, fruits , sandwiches, more home cooked   Exercise: 10 miles per day while working; starting to walk some now after stent placement but not every day  Family History: father MI, grandfather MI, and aunt MI  Social History: denies alcohol, current smoker 1 pack/per day  Labs: 11/23/2019: CHO 184; HDL 39; TG 98; LDL-c ?? (on ezetimibe 10mg )  Past Medical History:  Diagnosis Date  . Anxiety   . Blind    "born legally blind"  . Coronary artery disease   . DDD (degenerative disc disease), cervical   . DDD (degenerative disc disease), lumbosacral   . Depression   . GERD (gastroesophageal reflux disease)   . Headache    "stress related" (01/23/2018)  . Hx of cardiovascular stress test    ETT/Lexiscan Myoview (10/2013):  diaph atten vs inf scar, no ischemia, EF 56%.  Marland Kitchen Hypertriglyceridemia   . Melanoma ([place])    "right elbow"  . Myocardial infarction ([place]) 09/2013  . Nystagmus   . Pneumonia ~ 09/2017 X 1  . Skin cancer    "arms; forehead" (01/23/2018)    Current Outpatient Medications on File  Prior to Visit  Medication Sig Dispense Refill  . albuterol (VENTOLIN HFA) 108 (90 Base) MCG/ACT inhaler Inhale 1-2 puffs into the lungs every 4 (four) hours as needed.    Marland Kitchen aspirin EC 81 MG EC tablet Take 1 tablet (81 mg total) by mouth daily.    . clopidogrel (PLAVIX) 75 MG tablet Take 1 tablet (75 mg total) by mouth daily with breakfast. 90 tablet 3  . ezetimibe (ZETIA) 10 MG tablet Take 1 tablet (10 mg total) by mouth daily. 90 tablet 3  . isosorbide mononitrate (IMDUR) 30 MG 24 hr tablet Take 1.5 tablets (45 mg total) by mouth daily. 135 tablet 1  . losartan (COZAAR) 25 MG tablet TAKE 1/2 TABLET BY MOUTH EVERY DAY (PLEASE CUT IN HALF FOR PT) (Patient taking differently: Take 12.5 mg by mouth daily. ) 45 tablet 1  . nitroGLYCERIN (NITROSTAT) 0.4 MG SL tablet Place 1 tablet (0.4 mg total) under the tongue every 5 (five) minutes as needed for chest pain. 25 tablet 3  . pantoprazole (PROTONIX) 40 MG tablet Take 1 tablet (40 mg total) by mouth daily. 90 tablet 1  . triamcinolone ointment (KENALOG) 0.1 % Apply 1 application topically 3 (three) times daily as needed.     No current facility-administered medications on file prior to visit.  Allergies  Allergen Reactions  . Aleve [Naproxen Sodium] Shortness Of Breath and Other (See Comments)    Sweating, difficulty breathing  . Iodine Other (See Comments)    Blistering of skin (topical iodine/betadine)  . Statins Other (See Comments)    Severe Myopathy, joint pains    Hyperlipidemia with target LDL less than 70 LDL historically above 70mg /dL and above goal for secondary prevention. No recent LDL available, but TG from 11/23/2019 are greatly improved. We discussed options of nexletol, Nexlizet, Repath/Praluent, and low dose rosuvastatin to decrease cardiovascular risk. Patient will prefer trial with Nexlizet before injectables. Will repeat fasting lipid panel this week, and start Nexlizet if LDL remains above 70mg /dL. Plan to repeat BMET 2 weeks  after start of Nexlizet and follow up as needed. 2 weeks samples and co-pay carp were provided today during OV.   Sheyenne Konz Rodriguez-Guzman PharmD, BCPS, [place] [id number] 01/14/2020 5:31 PM

## 2020-01-14 NOTE — Patient Instructions (Addendum)
Your Results:             Your most recent labs Goal  Total Cholesterol 184 < 200  Triglycerides 98 < 150  HDL (happy/good cholesterol) 39 > 40  LDL (lousy/bad cholesterol ?? < 70      Medication changes:  * Will initiate Nexlizet 180mg /10mg   daily if LDL remains above 70mg /dL (STOP taking ezetimibe* and repeat blood work 2 weeks after   Lab orders:  *Fasting lipid panel in 1-2 weeks * *2nd blood work 2 weeks after initiating Nexlizet (if needed)   Thank you for choosing [facility]

## 2020-01-14 NOTE — Assessment & Plan Note (Signed)
LDL historically above 70mg /dL and above goal for secondary prevention. No recent LDL available, but TG from 11/23/2019 are greatly improved. We discussed options of nexletol, Nexlizet, Repath/Praluent, and low dose rosuvastatin to decrease cardiovascular risk. Patient will prefer trial with Nexlizet before injectables. Will repeat fasting lipid panel this week, and start Nexlizet if LDL remains above 70mg /dL. Plan to repeat BMET 2 weeks after start of Nexlizet and follow up as needed. 2 weeks samples and co-pay carp were provided today during OV.

## 2020-01-18 DIAGNOSIS — I10 Essential (primary) hypertension: Secondary | ICD-10-CM | POA: Diagnosis not present

## 2020-01-18 DIAGNOSIS — J441 Chronic obstructive pulmonary disease with (acute) exacerbation: Secondary | ICD-10-CM | POA: Diagnosis not present

## 2020-01-18 DIAGNOSIS — Z720 Tobacco use: Secondary | ICD-10-CM | POA: Diagnosis not present

## 2020-01-18 DIAGNOSIS — I2510 Atherosclerotic heart disease of native coronary artery without angina pectoris: Secondary | ICD-10-CM | POA: Diagnosis not present

## 2020-01-19 ENCOUNTER — Other Ambulatory Visit (HOSPITAL_COMMUNITY): Payer: PPO

## 2020-01-21 DIAGNOSIS — L239 Allergic contact dermatitis, unspecified cause: Secondary | ICD-10-CM | POA: Diagnosis not present

## 2020-01-21 DIAGNOSIS — Z6831 Body mass index (BMI) 31.0-31.9, adult: Secondary | ICD-10-CM | POA: Diagnosis not present

## 2020-01-25 ENCOUNTER — Ambulatory Visit (INDEPENDENT_AMBULATORY_CARE_PROVIDER_SITE_OTHER): Payer: PPO | Admitting: Cardiology

## 2020-01-25 ENCOUNTER — Encounter: Payer: Self-pay | Admitting: *Deleted

## 2020-01-25 ENCOUNTER — Encounter: Payer: Self-pay | Admitting: Cardiology

## 2020-01-25 ENCOUNTER — Other Ambulatory Visit: Payer: Self-pay

## 2020-01-25 VITALS — BP 118/70 | HR 79 | Ht 71.0 in | Wt 213.6 lb

## 2020-01-25 DIAGNOSIS — I2510 Atherosclerotic heart disease of native coronary artery without angina pectoris: Secondary | ICD-10-CM

## 2020-01-25 DIAGNOSIS — M79601 Pain in right arm: Secondary | ICD-10-CM

## 2020-01-25 NOTE — Patient Instructions (Signed)
Your physician recommends that you schedule a follow-up appointment in: [place]  Your physician recommends that you continue on your current medications as directed. Please refer to the Current Medication list given to you today.  Your physician has requested that you have an upper extremity arterial duplex. This test is an ultrasound of the arteries in the legs or arms. It looks at arterial blood flow in the legs and arms. Allow one hour for Lower and Upper Arterial scans. There are no restrictions or special instructions  Thank you for choosing [facility]!!
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
## 2020-01-27 ENCOUNTER — Ambulatory Visit (INDEPENDENT_AMBULATORY_CARE_PROVIDER_SITE_OTHER): Payer: PPO

## 2020-01-27 ENCOUNTER — Other Ambulatory Visit: Payer: Self-pay

## 2020-01-27 DIAGNOSIS — M79601 Pain in right arm: Secondary | ICD-10-CM | POA: Diagnosis not present

## 2020-01-29 ENCOUNTER — Encounter: Payer: Self-pay | Admitting: Pulmonary Disease

## 2020-01-29 ENCOUNTER — Telehealth: Payer: Self-pay | Admitting: *Deleted

## 2020-01-29 ENCOUNTER — Ambulatory Visit (INDEPENDENT_AMBULATORY_CARE_PROVIDER_SITE_OTHER): Payer: PPO | Admitting: Pulmonary Disease

## 2020-01-29 ENCOUNTER — Other Ambulatory Visit: Payer: Self-pay

## 2020-01-29 VITALS — BP 132/86 | HR 81 | Temp 97.5°F | Ht 70.0 in | Wt 210.0 lb

## 2020-01-29 DIAGNOSIS — Z716 Tobacco abuse counseling: Secondary | ICD-10-CM

## 2020-01-29 DIAGNOSIS — R0683 Snoring: Secondary | ICD-10-CM

## 2020-01-29 DIAGNOSIS — J432 Centrilobular emphysema: Secondary | ICD-10-CM | POA: Diagnosis not present

## 2020-01-29 NOTE — Progress Notes (Signed)
[facility]  Chief Complaint  Patient presents with  . Consult    Patient snores and stops breathing in sleep according to girlfriend. Denies waking up gasping for air. Does not have trouble going to sleep and does not wake up at all during the night.     Constitutional:  BP 132/86 (BP Location: Left Arm, Patient Position: Sitting, Cuff Size: Normal)   Pulse 81   Temp (!) 97.5 F (36.4 C) (Temporal)   Ht 5\' 10"  (1.778 m)   Wt 210 lb (95.3 kg)   SpO2 97%   BMI 30.13 kg/m   Past Medical History:  Anxiety, Legally Blind, CAD, Depression, GERD, HA, HLD, Melanoma, Nystagmus, PNA, Colon polyp  Summary:  Justin Santana is a 54 y.o. male smoker with snoring.  Subjective:   He was previously seen by Dr. Luan Pulling for COPD.  He still smokes 1/2 ppd, but has decreased a lot.  He plans to continue to gradually decrease.  Not having much cough, wheeze, or sputum.  Breathing better since he had heart cath with stenting.  His girlfriend told him he snores and would stop breathing at night.  He gets frequent leg cramps.  He goes to sleep at 11 pm.  He falls asleep in 5 minutes.  He sleeps through the night.  He gets out of bed at 6 am.  He feels okay in the morning.  He denies morning headache.  He does not use anything to help him fall sleep.  Drinks a cup of coffee in the morning.  He denies sleep walking, sleep talking, bruxism, or nightmares.  There is no history of restless legs.  He denies sleep hallucinations, sleep paralysis, or cataplexy.  The Epworth score is 4 out of 24.  Physical Exam:   Appearance - well kempt  ENMT - no sinus tenderness, no nasal discharge, no oral exudate, Mallampati 3  Respiratory - no wheeze, or rales  CV - regular rate and rhythm, no murmurs  GI - soft, non tender  Lymph - no adenopathy noted in neck  Ext - no edema  Skin - no rashes  Neuro - normal strength, oriented x 3  Psych - normal mood and  affect  Discussion:  He has snoring, and apnea.  He has hx of CAD. I am concerned he could have sleep apnea.  Assessment/Plan:   Snoring with excessive daytime sleepiness. - will need to arrange for a home sleep study  COPD with emphysema. - minimal symptoms at present - prn albuterol  Tobacco abuse. - he will gradually quit on his own - discuss additional options to help with smoking cessation  Obesity. - discussed how weight can impact sleep and risk for sleep disordered breathing - discussed options to assist with weight loss: combination of diet modification, cardiovascular and strength training exercises  Cardiovascular risk. - had an extensive discussion regarding the adverse health consequences related to untreated sleep disordered breathing - specifically discussed the risks for hypertension, coronary artery disease, cardiac dysrhythmias, cerebrovascular disease, and diabetes - lifestyle modification discussed  Safe driving practices. - discussed how sleep disruption can increase risk of accidents, particularly when driving - safe driving practices were discussed  Therapies for obstructive sleep apnea. - if the sleep study shows significant sleep apnea, then various therapies for treatment were reviewed: CPAP, oral appliance, and surgical interventions    A total of 32 minutes addressing patient care on the day of the visit.  Follow up:  Patient Instructions  Will arrange for home sleep study Will call to arrange for follow up after sleep study reviewed    Signature:  Chesley Mires, MD [place] Pager: [phone] 01/29/2020, 9:28 AM  Flow Sheet     Pulmonary tests:   PFT 10/01/18 >> FEV1 2.12 (54%), FEV1% 81, TLC 4.85 (67%), DLCO 51%  Chest imaging:   CT chest 12/04/19 >> emphysema, scattered pulmonary nodules stable since 2019  Sleep tests:    Cardiac tests:   Echo 01/24/18 >> EF 55 to 60%, grade 1 DD, mild AR, aortic root  42 mm  Medications:   Allergies as of 01/29/2020      Reactions   Aleve [naproxen Sodium] Shortness Of Breath, Other (See Comments)   Sweating, difficulty breathing   Iodine Other (See Comments)   Blistering of skin (topical iodine/betadine)   Statins Other (See Comments)   Severe Myopathy, joint pains      Medication List       Accurate as of January 29, 2020  9:28 AM. If you have any questions, ask your nurse or doctor.        albuterol 108 (90 Base) MCG/ACT inhaler Commonly known as: VENTOLIN HFA Inhale 1-2 puffs into the lungs every 4 (four) hours as needed.   aspirin 81 MG EC tablet Take 1 tablet (81 mg total) by mouth daily.   clopidogrel 75 MG tablet Commonly known as: PLAVIX Take 1 tablet (75 mg total) by mouth daily with breakfast.   ezetimibe 10 MG tablet Commonly known as: ZETIA Take 1 tablet (10 mg total) by mouth daily.   isosorbide mononitrate 30 MG 24 hr tablet Commonly known as: IMDUR Take 1.5 tablets (45 mg total) by mouth daily.   losartan 25 MG tablet Commonly known as: COZAAR TAKE 1/2 TABLET BY MOUTH EVERY DAY (PLEASE CUT IN HALF FOR PT) What changed: See the new instructions.   Nexlizet 180-10 MG Tabs Generic drug: Bempedoic Acid-Ezetimibe Take 1 tablet by mouth daily.   pantoprazole 40 MG tablet Commonly known as: PROTONIX Take 1 tablet (40 mg total) by mouth daily.   triamcinolone ointment 0.1 % Commonly known as: KENALOG Apply 1 application topically 3 (three) times daily as needed.       Past Surgical History:  He  has a past surgical history that includes Knee surgery (Right, 1995); left heart catheterization with coronary angiogram (N/A, 10/03/2013); Colonoscopy (N/A, 06/14/2017); polypectomy (06/14/2017); Skin cancer excision; Eye muscle surgery (Bilateral); Carpal tunnel with cubital tunnel (Left); Melanoma excision (Right); LEFT HEART CATH AND CORONARY ANGIOGRAPHY (N/A, 01/08/2020); and CORONARY STENT INTERVENTION (N/A,  01/08/2020).  Family History:  His family history includes Heart disease in his father.  Social History:  He  reports that he has been smoking cigarettes. He started smoking about 43 years ago. He has a 10.25 pack-year smoking history. He has never used smokeless tobacco. He reports previous alcohol use. He reports that he does not use drugs.

## 2020-01-29 NOTE — Telephone Encounter (Signed)
Pt voiced understanding and appreciative  

## 2020-01-29 NOTE — Patient Instructions (Signed)
Will arrange for home sleep study Will call to arrange for follow up after sleep study reviewed  

## 2020-01-29 NOTE — Telephone Encounter (Signed)
-----   Message from Arnoldo Lenis, MD sent at 01/28/2020  1:10 PM EDT ----- Right arm ultrasound looks good, no signs of any blood vessel damage. Some times some swelling in that area can affect the nerve that runs in that same area after cath and cause some pain for several days or sometimes a few weeks, this resovles with time. The important thing is there is no issues with the artery   [name] MD

## 2020-02-03 ENCOUNTER — Other Ambulatory Visit: Payer: PPO

## 2020-02-04 ENCOUNTER — Ambulatory Visit: Payer: PPO | Admitting: Cardiology

## 2020-02-08 ENCOUNTER — Institutional Professional Consult (permissible substitution): Payer: Self-pay | Admitting: Pulmonary Disease

## 2020-02-16 ENCOUNTER — Ambulatory Visit: Payer: PPO | Admitting: Family Medicine

## 2020-02-16 ENCOUNTER — Other Ambulatory Visit: Payer: PPO

## 2020-02-17 DIAGNOSIS — J441 Chronic obstructive pulmonary disease with (acute) exacerbation: Secondary | ICD-10-CM | POA: Diagnosis not present

## 2020-02-17 DIAGNOSIS — I2510 Atherosclerotic heart disease of native coronary artery without angina pectoris: Secondary | ICD-10-CM | POA: Diagnosis not present

## 2020-02-17 DIAGNOSIS — I10 Essential (primary) hypertension: Secondary | ICD-10-CM | POA: Diagnosis not present

## 2020-02-17 DIAGNOSIS — Z720 Tobacco use: Secondary | ICD-10-CM | POA: Diagnosis not present

## 2020-02-27 ENCOUNTER — Other Ambulatory Visit: Payer: Self-pay | Admitting: Cardiology

## 2020-03-02 ENCOUNTER — Telehealth: Payer: Self-pay | Admitting: Pulmonary Disease

## 2020-03-02 DIAGNOSIS — G4733 Obstructive sleep apnea (adult) (pediatric): Secondary | ICD-10-CM

## 2020-03-02 NOTE — Telephone Encounter (Signed)
Okay to change order to in lab split night sleep study.

## 2020-03-02 NOTE — Addendum Note (Signed)
Addended by: Maury Dus L on: 03/02/2020 05:00 PM   Modules accepted: Orders

## 2020-03-02 NOTE — Addendum Note (Signed)
Addended by: Len Blalock on: 03/02/2020 03:15 PM   Modules accepted: Orders

## 2020-03-02 NOTE — Telephone Encounter (Signed)
Dr Halford ChessmanVallarie Mare tried to schedule the pt's HST and stated the following:  I called pt to schedule appt for HOme study and he stated he was legally blind and would not be able to get here for a appt to pick up machine also there are lights on the machine that he would be unable to see trying to figure out how to proceed   Do you want to schedule him an in lab study? Please advise thanks

## 2020-03-02 NOTE — Telephone Encounter (Signed)
Spoke with pt, states that he does not wish to proceed with in lab sleep study because he does not have transportation to the sleep lab.  Pt has asked if his girlfriend (who is a Marine scientist at [employer]) could be trained to help set up this machine.   I have spoken with [place] and [place], who have agreed to take a sleep machine to [place] office and can show girlfriend how to use this machine.  Routing message to [place] to set up appt to meet with girlfriend and review how to use home sleep test.  Split night sleep study order has been cancelled.

## 2020-03-02 NOTE — Telephone Encounter (Signed)
Notified that home sleep study was cancelled and split night study was ordered instead. Detailed message left for patient.

## 2020-03-04 ENCOUNTER — Other Ambulatory Visit: Payer: Self-pay

## 2020-03-04 ENCOUNTER — Ambulatory Visit: Payer: PPO | Admitting: Pulmonary Disease

## 2020-03-04 DIAGNOSIS — G4733 Obstructive sleep apnea (adult) (pediatric): Secondary | ICD-10-CM

## 2020-03-08 ENCOUNTER — Telehealth: Payer: Self-pay | Admitting: Pulmonary Disease

## 2020-03-08 NOTE — Telephone Encounter (Signed)
Called and left message for patient to return call.  

## 2020-03-08 NOTE — Telephone Encounter (Signed)
HST 03/06/20 >> AHI 7.5, SpO2 low 86%.   Please inform him that his sleep study shows mild obstructive sleep apnea.  Please arrange for ROV with me or NP to discuss treatment options.

## 2020-03-09 NOTE — Telephone Encounter (Signed)
Pt returning a phone call. Pt can be reached at [phone].

## 2020-03-09 NOTE — Telephone Encounter (Signed)
F/U scheduled 03/25/20 Nothing further needed.

## 2020-03-09 NOTE — Telephone Encounter (Signed)
Returned call to patient and made aware of HST results. Pt would like a f/u appt scheduled in [place].

## 2020-03-18 DIAGNOSIS — I2510 Atherosclerotic heart disease of native coronary artery without angina pectoris: Secondary | ICD-10-CM | POA: Diagnosis not present

## 2020-03-18 DIAGNOSIS — J441 Chronic obstructive pulmonary disease with (acute) exacerbation: Secondary | ICD-10-CM | POA: Diagnosis not present

## 2020-03-18 DIAGNOSIS — I10 Essential (primary) hypertension: Secondary | ICD-10-CM | POA: Diagnosis not present

## 2020-03-18 DIAGNOSIS — Z720 Tobacco use: Secondary | ICD-10-CM | POA: Diagnosis not present

## 2020-03-25 ENCOUNTER — Encounter: Payer: Self-pay | Admitting: Adult Health

## 2020-03-25 ENCOUNTER — Ambulatory Visit (INDEPENDENT_AMBULATORY_CARE_PROVIDER_SITE_OTHER): Payer: PPO | Admitting: Adult Health

## 2020-03-25 ENCOUNTER — Other Ambulatory Visit: Payer: Self-pay

## 2020-03-25 VITALS — BP 130/60 | HR 64 | Temp 97.3°F | Ht 70.0 in | Wt 205.8 lb

## 2020-03-25 DIAGNOSIS — Z9989 Dependence on other enabling machines and devices: Secondary | ICD-10-CM | POA: Diagnosis not present

## 2020-03-25 DIAGNOSIS — G4733 Obstructive sleep apnea (adult) (pediatric): Secondary | ICD-10-CM

## 2020-03-25 NOTE — Patient Instructions (Addendum)
Begin CPAP At bedtime   Try to wear every night for at least 4-6 hr and more.  Work on healthy weight Do not drive if sleepy Follow up in 2 months and As needed

## 2020-03-25 NOTE — Assessment & Plan Note (Signed)
Mild OSA - treatment options discussed . With CAD hx . Wishes to proceed with CPAP   Plan  Patient Instructions  Begin CPAP At bedtime   Try to wear every night for at least 4-6 hr and more.  Work on healthy weight Do not drive if sleepy Follow up in 2 months and As needed

## 2020-03-25 NOTE — Progress Notes (Signed)
@Patient  ID: Justin Santana, male    DOB: 05-09-1966, 54 y.o.   MRN: [id number]  Chief Complaint  Patient presents with  . Follow-up    OSA     Referring provider: [name], MD  HPI: 54 year old male smoker seen for sleep consult in January 29, 2020 for snoring and witnessed apneic events patient was set up for a sleep study completed on March 08, 2020 that showed mild obstructive sleep apnea  Medical hx significant for CAD   TEST/EVENTS :  Sleep study March 08, 2020 AHI 7.5/hour SPO2 low 86%   PFT 10/01/18 >> FEV1 2.12 (54%), FEV1% 81, TLC 4.85 (67%), DLCO 51%  Chest imaging:   CT chest 12/04/19 >> emphysema, scattered pulmonary nodules stable since 2019   03/25/2020 Follow up : OSA  Patient returns for a 56-month follow-up.  Patient was seen last visit for a sleep consult for snoring and witnessed apneic events.  He was set up for a sleep study which was completed on March 08, 2020.  This showed mild sleep apnea with AHI at 7.5/hour and SPO2 low at 86%.  We discussed his sleep study results.  Went over treatment options including weight loss, oral appliance CPAP.  Patient would like to proceed with CPAP .   Patient is an active smoker.  Notable emphysema on previous CT scan.  Patient was recommended on smoking cessation.  Uses albuterol as needed.  Denies any significant cough or shortness of breath.   Allergies  Allergen Reactions  . Aleve [Naproxen Sodium] Shortness Of Breath and Other (See Comments)    Sweating, difficulty breathing  . Iodine Other (See Comments)    Blistering of skin (topical iodine/betadine)  . Statins Other (See Comments)    Severe Myopathy, joint pains    Immunization History  Administered Date(s) Administered  . Moderna SARS-COVID-2 Vaccination 10/20/2019, 01/01/2020    Past Medical History:  Diagnosis Date  . Anxiety   . Blind    "born legally blind"  . Coronary artery disease   . DDD (degenerative disc disease), cervical   . DDD  (degenerative disc disease), lumbosacral   . Depression   . GERD (gastroesophageal reflux disease)   . Headache    "stress related" (01/23/2018)  . Hx of cardiovascular stress test    ETT/Lexiscan Myoview (10/2013):  diaph atten vs inf scar, no ischemia, EF 56%.  Marland Kitchen Hypertriglyceridemia   . Melanoma ([place])    "right elbow"  . Myocardial infarction ([place]) 09/2013  . Nystagmus   . Pneumonia ~ 09/2017 X 1  . Skin cancer    "arms; forehead" (01/23/2018)    Tobacco History: Social History   Tobacco Use  Smoking Status Current Every Day Smoker  . Packs/day: 0.25  . Years: 41.00  . Pack years: 10.25  . Types: Cigarettes  . Start date: 09/29/1976  Smokeless Tobacco Never Used  Tobacco Comment   now down to 1/2 pack 01/25/2020   Ready to quit: No Counseling given: Yes Comment: now down to 1/2 pack 01/25/2020   Outpatient Medications Prior to Visit  Medication Sig Dispense Refill  . albuterol (VENTOLIN HFA) 108 (90 Base) MCG/ACT inhaler Inhale 1-2 puffs into the lungs every 4 (four) hours as needed.    Marland Kitchen aspirin EC 81 MG EC tablet Take 1 tablet (81 mg total) by mouth daily.    . Bempedoic Acid-Ezetimibe (NEXLIZET) 180-10 MG TABS Take 1 tablet by mouth daily. 14 tablet 0  . clopidogrel (PLAVIX) 75 MG tablet  Take 1 tablet (75 mg total) by mouth daily with breakfast. 90 tablet 3  . ezetimibe (ZETIA) 10 MG tablet Take 1 tablet (10 mg total) by mouth daily. 90 tablet 3  . isosorbide mononitrate (IMDUR) 30 MG 24 hr tablet Take 1.5 tablets (45 mg total) by mouth daily. 135 tablet 1  . losartan (COZAAR) 25 MG tablet TAKE 1/2 TABLET BY MOUTH EVERY DAY (PLEASE CUT IN HALF FOR PT) 45 tablet 1  . pantoprazole (PROTONIX) 40 MG tablet Take 1 tablet (40 mg total) by mouth daily. 90 tablet 1  . triamcinolone ointment (KENALOG) 0.1 % Apply 1 application topically 3 (three) times daily as needed.     No facility-administered medications prior to visit.     Review of Systems:   Constitutional:   No   weight loss, night sweats,  Fevers, chills, fatigue, or  lassitude.  HEENT:   No headaches,  Difficulty swallowing,  Tooth/dental problems, or  Sore throat,                No sneezing, itching, ear ache, nasal congestion, post nasal drip,   CV:  No chest pain,  Orthopnea, PND, swelling in lower extremities, anasarca, dizziness, palpitations, syncope.   GI  No heartburn, indigestion, abdominal pain, nausea, vomiting, diarrhea, change in bowel habits, loss of appetite, bloody stools.   Resp: No shortness of breath with exertion or at rest.  No excess mucus, no productive cough,  No non-productive cough,  No coughing up of blood.  No change in color of mucus.  No wheezing.  No chest wall deformity  Skin: no rash or lesions.  GU: no dysuria, change in color of urine, no urgency or frequency.  No flank pain, no hematuria   MS:  No joint pain or swelling.  No decreased range of motion.  No back pain.    Physical Exam  BP 130/60 (BP Location: Left Arm, Cuff Size: Normal)   Pulse 64   Temp (!) 97.3 F (36.3 C) (Oral)   Ht 5\' 10"  (1.778 m)   Wt 205 lb 12.8 oz (93.4 kg)   SpO2 96%   BMI 29.53 kg/m   GEN: A/Ox3; pleasant , NAD, well nourished    HEENT:  /AT, , NOSE-clear, THROAT-clear, no lesions, no postnasal drip or exudate noted.   NECK:  Supple w/ fair ROM; no JVD; normal carotid impulses w/o bruits; no thyromegaly or nodules palpated; no lymphadenopathy.  Class 3 MP airway   RESP  Clear  P & A; w/o, wheezes/ rales/ or rhonchi. no accessory muscle use, no dullness to percussion  CARD:  RRR, no m/r/g, no peripheral edema, pulses intact, no cyanosis or clubbing.  GI:   Soft & nt; nml bowel sounds; no organomegaly or masses detected.   Musco: Warm bil, no deformities or joint swelling noted.   Neuro: alert, no focal deficits noted.    Skin: Warm, no lesions or rashes    Lab Results:  CBC    Component Value Date/Time   WBC 16.3 (H) 01/09/2020 0042   RBC 4.33 01/09/2020  0042   HGB 13.2 01/09/2020 0042   HCT 39.8 01/09/2020 0042   PLT 129 (L) 01/09/2020 0042   MCV 91.9 01/09/2020 0042   MCH 30.5 01/09/2020 0042   MCHC 33.2 01/09/2020 0042   RDW 12.6 01/09/2020 0042   LYMPHSABS 2.4 01/23/2018 1035   MONOABS 0.6 01/23/2018 1035   EOSABS 0.2 01/23/2018 1035   BASOSABS 0.1 01/23/2018 1035  BMET    Component Value Date/Time   NA 139 01/09/2020 0042   K 3.4 (L) 01/09/2020 0042   CL 106 01/09/2020 0042   CO2 21 (L) 01/09/2020 0042   GLUCOSE 162 (H) 01/09/2020 0042   BUN 16 01/09/2020 0042   CREATININE 1.12 01/09/2020 0042   CALCIUM 7.9 (L) 01/09/2020 0042   GFRNONAA >60 01/09/2020 0042   GFRAA >60 01/09/2020 0042    BNP No results found for: BNP  ProBNP    Component Value Date/Time   PROBNP 15.4 10/03/2013 1737    Imaging: No results found.    PFT Results Latest Ref Rng & Units 10/01/2018  FVC-Pre L 2.60  FVC-Predicted Pre % 51  FVC-Post L 2.63  FVC-Predicted Post % 51  Pre FEV1/FVC % % 81  Post FEV1/FCV % % 80  FEV1-Pre L 2.12  FEV1-Predicted Pre % 54  FEV1-Post L 2.11  DLCO uncorrected ml/min/mmHg 15.09  DLCO UNC% % 51  DLVA Predicted % 82  TLC L 4.85  TLC % Predicted % 67  RV % Predicted % 97    No results found for: NITRICOXIDE      Assessment & Plan:   OSA (obstructive sleep apnea) Mild OSA - treatment options discussed . With CAD hx . Wishes to proceed with CPAP   Plan  Patient Instructions  Begin CPAP At bedtime   Try to wear every night for at least 4-6 hr and more.  Work on healthy weight Do not drive if sleepy Follow up in 2 months and As needed           [employer], NP 03/25/2020

## 2020-03-28 NOTE — Progress Notes (Signed)
Reviewed and agree with assessment/plan.   Chesley Mires, MD [facility] Pulmonary/Critical Care 03/28/2020, 7:15 AM Pager:  [phone]

## 2020-04-19 DIAGNOSIS — J441 Chronic obstructive pulmonary disease with (acute) exacerbation: Secondary | ICD-10-CM | POA: Diagnosis not present

## 2020-04-19 DIAGNOSIS — I2510 Atherosclerotic heart disease of native coronary artery without angina pectoris: Secondary | ICD-10-CM | POA: Diagnosis not present

## 2020-04-19 DIAGNOSIS — I10 Essential (primary) hypertension: Secondary | ICD-10-CM | POA: Diagnosis not present

## 2020-04-19 DIAGNOSIS — Z720 Tobacco use: Secondary | ICD-10-CM | POA: Diagnosis not present

## 2020-04-22 DIAGNOSIS — Z20828 Contact with and (suspected) exposure to other viral communicable diseases: Secondary | ICD-10-CM | POA: Diagnosis not present

## 2020-05-06 DIAGNOSIS — M5431 Sciatica, right side: Secondary | ICD-10-CM | POA: Diagnosis not present

## 2020-05-06 DIAGNOSIS — M5386 Other specified dorsopathies, lumbar region: Secondary | ICD-10-CM | POA: Diagnosis not present

## 2020-05-06 DIAGNOSIS — Z6829 Body mass index (BMI) 29.0-29.9, adult: Secondary | ICD-10-CM | POA: Diagnosis not present

## 2020-05-16 DIAGNOSIS — M5431 Sciatica, right side: Secondary | ICD-10-CM | POA: Diagnosis not present

## 2020-05-16 DIAGNOSIS — T50905A Adverse effect of unspecified drugs, medicaments and biological substances, initial encounter: Secondary | ICD-10-CM | POA: Diagnosis not present

## 2020-05-17 ENCOUNTER — Other Ambulatory Visit: Payer: Self-pay | Admitting: Cardiology

## 2020-05-19 DIAGNOSIS — J441 Chronic obstructive pulmonary disease with (acute) exacerbation: Secondary | ICD-10-CM | POA: Diagnosis not present

## 2020-05-19 DIAGNOSIS — Z720 Tobacco use: Secondary | ICD-10-CM | POA: Diagnosis not present

## 2020-05-19 DIAGNOSIS — I10 Essential (primary) hypertension: Secondary | ICD-10-CM | POA: Diagnosis not present

## 2020-05-19 DIAGNOSIS — I2510 Atherosclerotic heart disease of native coronary artery without angina pectoris: Secondary | ICD-10-CM | POA: Diagnosis not present

## 2020-05-26 DIAGNOSIS — Z6828 Body mass index (BMI) 28.0-28.9, adult: Secondary | ICD-10-CM | POA: Diagnosis not present

## 2020-05-26 DIAGNOSIS — F419 Anxiety disorder, unspecified: Secondary | ICD-10-CM | POA: Diagnosis not present

## 2020-05-26 DIAGNOSIS — M5386 Other specified dorsopathies, lumbar region: Secondary | ICD-10-CM | POA: Diagnosis not present

## 2020-06-02 ENCOUNTER — Ambulatory Visit: Payer: PPO | Admitting: Cardiology
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
## 2020-06-06 ENCOUNTER — Other Ambulatory Visit: Payer: Self-pay | Admitting: Cardiology
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
## 2020-06-14 ENCOUNTER — Encounter: Payer: Self-pay | Admitting: Family Medicine

## 2020-06-14 ENCOUNTER — Ambulatory Visit (INDEPENDENT_AMBULATORY_CARE_PROVIDER_SITE_OTHER): Payer: PPO | Admitting: Family Medicine

## 2020-06-14 VITALS — BP 116/72 | HR 61 | Ht 70.0 in | Wt 200.8 lb

## 2020-06-14 DIAGNOSIS — E785 Hyperlipidemia, unspecified: Secondary | ICD-10-CM | POA: Diagnosis not present

## 2020-06-14 DIAGNOSIS — I2510 Atherosclerotic heart disease of native coronary artery without angina pectoris: Secondary | ICD-10-CM

## 2020-06-14 DIAGNOSIS — I10 Essential (primary) hypertension: Secondary | ICD-10-CM

## 2020-06-14 NOTE — Patient Instructions (Addendum)
Medication Instructions:  Continue all current medications.   Labwork: none  Testing/Procedures: none  Follow-Up: 6 months   Any Other Special Instructions Will Be Listed Below (If Applicable).   If you need a refill on your cardiac medications before your next appointment, please call your pharmacy.  

## 2020-06-18 DIAGNOSIS — J441 Chronic obstructive pulmonary disease with (acute) exacerbation: Secondary | ICD-10-CM | POA: Diagnosis not present

## 2020-06-18 DIAGNOSIS — I2510 Atherosclerotic heart disease of native coronary artery without angina pectoris: Secondary | ICD-10-CM | POA: Diagnosis not present

## 2020-06-18 DIAGNOSIS — I10 Essential (primary) hypertension: Secondary | ICD-10-CM | POA: Diagnosis not present

## 2020-06-18 DIAGNOSIS — Z720 Tobacco use: Secondary | ICD-10-CM | POA: Diagnosis not present

## 2020-06-22 ENCOUNTER — Other Ambulatory Visit: Payer: Self-pay | Admitting: Cardiology

## 2020-06-28 DIAGNOSIS — M5386 Other specified dorsopathies, lumbar region: Secondary | ICD-10-CM | POA: Diagnosis not present

## 2020-06-28 DIAGNOSIS — Z6829 Body mass index (BMI) 29.0-29.9, adult: Secondary | ICD-10-CM | POA: Diagnosis not present

## 2020-06-28 DIAGNOSIS — G252 Other specified forms of tremor: Secondary | ICD-10-CM | POA: Diagnosis not present

## 2020-06-28 DIAGNOSIS — M25551 Pain in right hip: Secondary | ICD-10-CM | POA: Diagnosis not present

## 2020-07-01 ENCOUNTER — Encounter: Payer: Self-pay | Admitting: Internal Medicine

## 2020-07-10 DIAGNOSIS — G4733 Obstructive sleep apnea (adult) (pediatric): Secondary | ICD-10-CM | POA: Diagnosis not present

## 2020-07-13 ENCOUNTER — Other Ambulatory Visit: Payer: Self-pay

## 2020-07-13 ENCOUNTER — Ambulatory Visit: Payer: PPO | Admitting: Neurology

## 2020-07-13 ENCOUNTER — Encounter: Payer: Self-pay | Admitting: Neurology

## 2020-07-13 VITALS — BP 131/84 | HR 74 | Ht 70.0 in | Wt 200.0 lb

## 2020-07-13 DIAGNOSIS — R251 Tremor, unspecified: Secondary | ICD-10-CM
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
## 2020-07-13 NOTE — Patient Instructions (Signed)
You have a mild tremor of both hands.  I do not see any signs or symptoms of parkinson's like disease or what we call parkinsonism.   For your tremor, I would not recommend any new medication for fear of side effects and in light of the mild tremor.  For your back pain, I recommend you make a follow-up appointment with your orthopedic specialist.  Please remember, that any kind of tremor may be exacerbated by being in pain, by anxiety, anger, nervousness, excitement, dehydration, sleep deprivation, by caffeine, and low blood sugar values or blood sugar fluctuations and thyroid disease. Some medications can exacerbate tremors, this includes antidepressant medications such as Cymbalta.  You noticed an increase in your tremor when you were on Cymbalta.  I would recommend that you increase your water intake.  You may be suboptimally hydrated chronically.  Please try to limit your caffeine intake to 2 servings per day at the most.  I think you really would benefit from drinking more water, 6 to 8 cups/day are generally recommended, 8 ounce size each.    Please follow-up with your primary care physician or PA as scheduled, I would recommend making sure that your thyroid function is normal.  You can have this checked with the simple blood work at the next visit.  It is possible that your primary care has done it recently.

## 2020-07-15 ENCOUNTER — Telehealth: Payer: Self-pay | Admitting: Nurse Practitioner

## 2020-07-15 NOTE — Telephone Encounter (Signed)
   Pt called to report that beginning Wed night, a few hrs after eating a salad w/ [place] dsg, he developed somewhat severe sscp/pressure/tightness assoc w/ mild diaphoresis and nausea.  He assumed he was having heartburn related to the salad dsg, as he hadn't had that type of dsg in years.  Eventually Ss improved enough for him to doze off to sleep but he has cont to have a low level chest pressure ever since.  He did not eat much yesterday out of fear for worsening the discomfort.  He has not had any dyspnea but cont to report low-level c/p.  I rec that he present to the ED for eval w/ question of ACS occurring on Wed night.  He really wants to avoid the ED, but I reiterated by inability to evaluate his Ss over the phone and objectively r/o a cardiac event.  He will consider presenting to [facility] today.  Caller verbalized understanding and was grateful for the call back.  Murray Hodgkins, NP 07/15/2020, 9:34 AM

## 2020-07-19 DIAGNOSIS — I10 Essential (primary) hypertension: Secondary | ICD-10-CM | POA: Diagnosis not present

## 2020-07-19 DIAGNOSIS — J441 Chronic obstructive pulmonary disease with (acute) exacerbation: Secondary | ICD-10-CM | POA: Diagnosis not present

## 2020-07-19 DIAGNOSIS — Z720 Tobacco use: Secondary | ICD-10-CM | POA: Diagnosis not present

## 2020-07-21 DIAGNOSIS — Z6829 Body mass index (BMI) 29.0-29.9, adult: Secondary | ICD-10-CM | POA: Diagnosis not present

## 2020-07-21 DIAGNOSIS — M5386 Other specified dorsopathies, lumbar region: Secondary | ICD-10-CM | POA: Diagnosis not present

## 2020-07-21 DIAGNOSIS — G252 Other specified forms of tremor: Secondary | ICD-10-CM | POA: Diagnosis not present

## 2020-07-21 DIAGNOSIS — M25551 Pain in right hip: Secondary | ICD-10-CM | POA: Diagnosis not present

## 2020-08-16 DIAGNOSIS — L239 Allergic contact dermatitis, unspecified cause: Secondary | ICD-10-CM | POA: Diagnosis not present

## 2020-08-19 DIAGNOSIS — I10 Essential (primary) hypertension: Secondary | ICD-10-CM | POA: Diagnosis not present

## 2020-08-19 DIAGNOSIS — I2510 Atherosclerotic heart disease of native coronary artery without angina pectoris: Secondary | ICD-10-CM | POA: Diagnosis not present

## 2020-08-19 DIAGNOSIS — Z720 Tobacco use: Secondary | ICD-10-CM | POA: Diagnosis not present

## 2020-08-19 DIAGNOSIS — J441 Chronic obstructive pulmonary disease with (acute) exacerbation: Secondary | ICD-10-CM | POA: Diagnosis not present

## 2020-08-21 ENCOUNTER — Other Ambulatory Visit: Payer: Self-pay | Admitting: Cardiology

## 2020-09-10 ENCOUNTER — Telehealth: Payer: Self-pay | Admitting: Cardiology

## 2020-09-10 NOTE — Telephone Encounter (Signed)
Patient called stating that he has not felt well.  He says that his BP has been running 109-119/38mmHg.  He says that he feels like his left arm feels like it has been overworked.  He feels SOB and has some mild chest pressure.  He says that each time he has had a stent his discomfort has been different.  The left arm feels like he has slept on it wrong.  Recommended patient go to [facility] immediately for evaluation. Patient agrees with plan.

## 2020-09-12 ENCOUNTER — Other Ambulatory Visit: Payer: Self-pay

## 2020-09-12 ENCOUNTER — Encounter: Payer: Self-pay | Admitting: Family Medicine

## 2020-09-12 ENCOUNTER — Telehealth: Payer: Self-pay | Admitting: Cardiology

## 2020-09-12 ENCOUNTER — Ambulatory Visit: Payer: Medicare HMO | Admitting: Family Medicine

## 2020-09-12 VITALS — BP 122/68 | HR 86 | Resp 16 | Ht 70.0 in | Wt 203.8 lb

## 2020-09-12 DIAGNOSIS — R0602 Shortness of breath: Secondary | ICD-10-CM

## 2020-09-12 DIAGNOSIS — I10 Essential (primary) hypertension: Secondary | ICD-10-CM

## 2020-09-12 MED ORDER — ISOSORBIDE MONONITRATE ER 30 MG PO TB24: 30.0000 mg | ORAL_TABLET | Freq: Every day | ORAL | 6 refills | Status: AC

## 2020-09-12 MED ORDER — NITROGLYCERIN 0.4 MG SL SUBL: 0.4000 mg | SUBLINGUAL_TABLET | SUBLINGUAL | 3 refills | Status: AC | PRN

## 2020-09-12 NOTE — Telephone Encounter (Signed)
Pt c/o of Left Arm Pain.It started Sat night 09/10/20.  1. He is still having left arm pain right now.  2. Are you experiencing any other symptoms (ex. SOB, nausea, vomiting, sweating)? Sat EMS was called for shortness of breath, fatigue, hold/cold sweats and tightness in his chest.  3. Not currently experiencing issues except he still has the pain in the left arm.   4. Is your CP continuous or coming and going? NO  5. Have you taken Nitroglycerin? NO ? Saturday the EMS was called for the shortness of breath and they did EKG. It was normal. He said Sunday 09/11/20 he felt better but still concerned about the left arm pain.
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
## 2020-09-12 NOTE — Patient Instructions (Addendum)
Medication Instructions:   Begin Imdur 30mg  daily.   Begin Nitroglycerin as needed for severe chest pain.   Continue all other medications.    Labwork: none  Testing/Procedures:  Your physician has requested that you have an echocardiogram. Echocardiography is a painless test that uses sound waves to create images of your heart. It provides your doctor with information about the size and shape of your heart and how well your heart's chambers and valves are working. This procedure takes approximately one hour. There are no restrictions for this procedure.  Office will contact with results via phone or letter.    Follow-Up: 4 weeks   Any Other Special Instructions Will Be Listed Below (If Applicable).  If you need a refill on your cardiac medications before your next appointment, please call your pharmacy.

## 2020-09-12 NOTE — Telephone Encounter (Signed)
We will see how evaluation goes later today   J Norbert Malkin MD

## 2020-09-12 NOTE — Telephone Encounter (Signed)
Reports left arm pain located below shoulder that he describes as "flu shot soreness feeling" rated 6/10. Does not have nitroglycerin. Has not taken tylenol to see if he gets relief. Denies SOB or dizziness now. Reports that symptoms started on Saturday with chest tightness, sob, sweating and left arm pain. Reports not doing any strenuous activities. Reports having a slight cough. Denies congestion, fever, n/v or diarrhea. Offered 3:30 pm appointment for today and patient declined due to transportation issues. Advised that next available appointment is 09/29/2020. Reports that he is moving to [place] with daughter on 10/01/20. Agreed to appointment today at 3:30 pm with Katina Dung, NP and says he will try to get transportation. Advised to go ahead and take tylenol to see if left arm pain symptoms improve. Verbalized understanding of plan.
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
## 2020-09-29 ENCOUNTER — Ambulatory Visit (INDEPENDENT_AMBULATORY_CARE_PROVIDER_SITE_OTHER): Payer: Medicare HMO

## 2020-09-29 DIAGNOSIS — R0602 Shortness of breath: Secondary | ICD-10-CM | POA: Diagnosis not present

## 2020-09-29 LAB — ECHOCARDIOGRAM COMPLETE
AV Vena cont: 0.5 cm
Area-P 1/2: 3.5 cm2
Calc EF: 62.4 %
P 1/2 time: 322 msec
S' Lateral: 2.92 cm
Single Plane A2C EF: 56.4 %
Single Plane A4C EF: 66.9 %

## 2020-09-30 ENCOUNTER — Encounter: Payer: Self-pay | Admitting: Family Medicine

## 2020-09-30 ENCOUNTER — Ambulatory Visit (INDEPENDENT_AMBULATORY_CARE_PROVIDER_SITE_OTHER): Payer: Medicare HMO | Admitting: Family Medicine

## 2020-09-30 VITALS — BP 128/72 | HR 82 | Ht 71.0 in | Wt 205.2 lb

## 2020-09-30 DIAGNOSIS — I10 Essential (primary) hypertension: Secondary | ICD-10-CM | POA: Diagnosis not present

## 2020-09-30 DIAGNOSIS — E785 Hyperlipidemia, unspecified: Secondary | ICD-10-CM

## 2020-09-30 DIAGNOSIS — I2510 Atherosclerotic heart disease of native coronary artery without angina pectoris: Secondary | ICD-10-CM

## 2020-10-11 DIAGNOSIS — Z6829 Body mass index (BMI) 29.0-29.9, adult: Secondary | ICD-10-CM | POA: Diagnosis not present

## 2020-10-11 DIAGNOSIS — K089 Disorder of teeth and supporting structures, unspecified: Secondary | ICD-10-CM | POA: Diagnosis not present

## 2020-10-11 DIAGNOSIS — Z955 Presence of coronary angioplasty implant and graft: Secondary | ICD-10-CM | POA: Diagnosis not present

## 2020-10-11 DIAGNOSIS — I2510 Atherosclerotic heart disease of native coronary artery without angina pectoris: Secondary | ICD-10-CM | POA: Diagnosis not present

## 2020-12-20 ENCOUNTER — Other Ambulatory Visit: Payer: Self-pay | Admitting: Cardiology

## 2020-12-20 ENCOUNTER — Other Ambulatory Visit: Payer: Self-pay | Admitting: Medical

## 2020-12-31 ENCOUNTER — Other Ambulatory Visit: Payer: Self-pay | Admitting: Cardiology

## 2021-02-10 ENCOUNTER — Other Ambulatory Visit: Payer: Self-pay | Admitting: Cardiology

## 2021-06-20 NOTE — Telephone Encounter (Signed)
CALLED MEDICAL CASE MANAGER KELLY HEATH AT [phone] AND LEFT VM.  REQ [employer] PHONE NUMBER TO DETERMINE IF WE CAN NEGOTIATE [place] [facility] FEE SCHEDULE PAYMENT FOR THIS OUT OF STATE CLAIM.

## 2021-08-01 NOTE — Telephone Encounter (Signed)
Patient stopped in states he needs to make an appt with Dr. Christie Beckers.  I saw he is [facility].  Can we schedule?

## 2021-08-09 NOTE — Telephone Encounter (Signed)
EMAIL FROM [employer] TO SCHEDULE FOR APPOINTMENT FOR PAIN MANAGEMENT.         TALKED TO CASE MANAGER AND THEY WILL PAY PER THE [place] FEE SCHEDULE FOR WC.

## 2021-09-06 ENCOUNTER — Institutional Professional Consult (permissible substitution)
Admit: 2021-09-06 | Discharge: 2021-09-06 | Payer: PRIVATE HEALTH INSURANCE | Attending: Physical Medicine & Rehabilitation | Primary: Family Medicine

## 2021-09-06 MED ORDER — LIDOCAINE 4 % EX CREA
4 % | CUTANEOUS | 1 refills | Status: AC
Start: 2021-09-06 — End: ?

## 2021-09-06 NOTE — Progress Notes (Signed)
Melvin Sanders  (1966-05-26)    09/06/2021    Subjective:     Melvin Sanders is 56 y.o. male who complains today of:    Chief Complaint   Patient presents with    Back Pain     Lower right side        Melvin Sanders is a 56 y.o. male who presents for evaluation by request of Dr. Myrene Galas for lumbar radicular pain.    He has struggled with pain for over 3 years. He denies any immediately-preceding traumatic or inciting events. He has been previously evaluated by Dr Clydene Pugh whose records are reviewed below. He describes pain located in the right low back and into his right leg.  Pain is a constant ache and is currently a 6/10 and gets up to a 10/10 at its worst and goes down to a 5/10 at its best. Pain is worse with working and bending. Pain is better with standing.  Pain is located 100% on the right and 0% on the left. Pain is located 50% in the back and 50% in the legs.    He denies any numbness, tingling, weakness, bowel or bladder dysfunction, saddle anesthesia, falls, unexplained weight loss, persistent night pain and sweats, fever, IV drug abuse, immunocompromise, chronic prednisone or antibiotic use, or any other red flag symptoms. Mood is down, denies any suicidal or homicidal ideation. Sleep is poor, awakes fatigued.    He has tried:  Home exercise program with minimal relief  Physical Therapy at [facility]  Prior injection worked, but feels like pain returns worse than before    Diagnostic testing previously performed includes XRs    Medications tried include:  Acetaminophen with minimal relief for over 3 months  Ibuprofen with minimal relief for over 3 months  Duloxetine 60 mg for mood    Allergies, Medications, Past Medical History, Family History, Social History, Work History, and Review of Systems reviewed below     +CAD and MI with 6 stents on Aspirin and Plavix  +TIA  +skin cancer ?basal cell  +OSA not compliant with CPAP  +Depression and  Anxiety on Cymbalta Duloxetine    No Seizures, Epilepsy or Brain Surgery     Spends his time: 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 He used to enjoy playing football, walking. 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."      Allergies:  Aleve [naproxen] and Iodine    Past Medical History:   Diagnosis Date    Cancer (HCC)     Skin    CHF (congestive heart failure) (HCC)     Chronic back pain     Coronary artery disease     Depression     Hypertension     Sleep apnea      Past Surgical History:   Procedure Laterality Date    KNEE SURGERY Right      Family History   Problem Relation Age of Onset    Diabetes Mother     Heart Disease Father     High Blood Pressure Father      Social History     Socioeconomic History    Marital status: Unknown     Spouse name: Not on file    Number of children: Not on file    Years of education: Not on file    Highest education level: Not on file  Occupational History    Not on file   Tobacco Use    Smoking status: Former     Types: Cigarettes    Smokeless tobacco: Never   Substance and Sexual Activity    Alcohol use: Not on file    Drug use: Not on file    Sexual activity: Not on file   Other Topics Concern    Not on file   Social History Narrative    Not on file     Social Determinants of Health     Financial Resource Strain: Not on file   Food Insecurity: Not on file   Transportation Needs: Not on file   Physical Activity: Not on file   Stress: Not on file   Social Connections: Not on file   Intimate Partner Violence: Not on file   Housing Stability: Not on file       Current Outpatient Medications on File Prior to Visit   Medication Sig Dispense Refill    AMOXICILLIN PO Take 125 mg by mouth 2 times daily as needed      pravastatin (PRAVACHOL) 40 MG tablet Take 40 mg by mouth daily      DULoxetine (CYMBALTA) 60 MG extended release capsule Take 60 mg by mouth daily      pantoprazole (PROTONIX) 40 MG tablet Take 40 mg by mouth daily       losartan (COZAAR) 25 MG tablet Take 25 mg by mouth daily      ezetimibe (ZETIA) 10 MG tablet Take 10 mg by mouth daily      ISOSORBIDE DINITRATE PO Take 60 mg by mouth      clopidogrel (PLAVIX) 75 MG tablet Take 75 mg by mouth daily      aspirin 81 MG EC tablet Take 81 mg by mouth daily      nitroGLYCERIN (NITROSTAT) 0.4 MG SL tablet Place 0.4 mg under the tongue every 5 minutes as needed for Chest pain up to max of 3 total doses. If no relief after 1 dose, call 911.       No current facility-administered medications on file prior to visit.       Review of Systems   Constitutional:  Negative for fever.   HENT:  Negative for hearing loss.    Respiratory:  Negative for shortness of breath.    Gastrointestinal:  Negative for constipation, diarrhea and nausea.   Genitourinary:  Negative for difficulty urinating.   Musculoskeletal:  Positive for back pain. Negative for neck pain.   Skin:  Negative for rash.   Neurological:  Negative for headaches.   Hematological:  Does not bruise/bleed easily.   Psychiatric/Behavioral:  Negative for sleep disturbance.        Objective:     Vitals:  BP 130/82 (Site: Left Upper Arm, Position: Sitting)    Temp 98 ??F (36.7 ??C)    Ht 5\' 10"  (1.778 m)    Wt 180 lb (81.6 kg)    BMI 25.83 kg/m?? Pain Score:   6      Exam performed under Coronavirus precautions  Gen: No acute distress  Neck: Grossly symmetric without any significant thyromegaly or masses appreciated.  Eyes: No scleral icterus or lid lag appreciated bilaterally. Irises without gross defects bilaterally. Decreased visual acuity.   HEENT: Hearing grossly intact bilaterally. Normocephalic, external ears and visible portions of nose and mouth atraumatic.  Lymph: No gross neck or axillary lymphadenopathy  Cardio: No significant lower extremity edema, pulses intact without significant  digit ischemia.  Abd: No gross masses or large hernias appreciated.   Skin: Visualized skin without any dermatomal rashes or sores. Palpation free of any  tightening or subcutaneous nodules.  MSK: Gait is antalgic. No significant upper limb digit ischemia appreciated.  Psych: Pleasant and cooperative with the history and exam. Mood and Affect normal. Appropriately dressed with good eye contact. Judgement and insight normal. Recent and remote memory intact. Alert and Oriented x3.  Neuro: Cranial nerves II-XII grossly intact. No significant pathologic reflexes appreciated.    Rises from a seated to standing position with mild difficulty. Gait is antalgic. No assistive devices used.    Heel and toe walk intact. Lumbar flexion to 30 degrees, extension to 25 degrees. Extension bias. Limited lumbar spine range of motion. Rotation and extension reproduces axial low back pain. Other facet provocative maneuvers are positive.    No gross step offs noted. Tenderness to palpation over the mid to low lumbar spinous processes and right lumbar paraspinals from L2 down to the sacrum. tenderness over right PSIS and superior cluneal region. tenderness over right greater trochanter. No tenderness over bilateral deep gluteal regions.    Sensation grossly intact in both legs except for right L5 paresthesias  Reflexes and strength functional for ambulation, no abnormal reflexes appreciated on exam today  Strength greater than 3/5 bilateral legs  Straight leg raise negative bilaterally.          Outside record review:  Review of the original consultation request reveals no specific diagnostic requests or clinical concerns aside from lumbar radiculopathy that require particular attention. There are no suggested, requested, or specified tests to be ordered or any prior diagnostics performed that require follow-up or further investigation.    Dr Clydene PughBerkowitz records not available for review at the time of the clinical encounter    XR LS Spine 01/10/21: mild degenerative changes, scoliosis, no fractures, flexion extension preserved, mild bilateral hip arthritis.    Labs 11/21/20:   Creatinine 1  normal  Platelet 178 normal          Family history of alcohol abuse 0  Family history of illegal drug abuse 0  Family history of prescription drug abuse 0    Personal history of alcohol abuse/DUI 0  Personal history of illegal drug abuse 0  Personal history of prescription drug abuse 0    Age between 7116-43 0    History of preadolescent sexual abuse 0    Personal history of obsessive compulsive disorder 0  Personal history of attention deficit disorder 0  Personal history of bipolar disorder 0  Personal history of schizophrenia 0  Personal history of depression +1    Score = 1, low risk  Assessment:      Diagnosis Orders   1. Lumbosacral spondylosis without myelopathy  [facility] [place]    lidocaine (LMX) 4 % cream      2. Right leg pain  EMG          Plan:     Periodic Controlled Substance Monitoring: Assessed functional status. Grayland Jack(Samuel Mcpeek, MD)    Orders Placed This Encounter   Medications    lidocaine (LMX) 4 % cream     Sig: Apply a half dollar sized amount to intact skin topically up to twice daily as needed for pain     Dispense:  45 g     Refill:  1         Orders Placed This Encounter   Procedures  [facility] [place]     Referral Priority:   Routine     Referral Type:   Eval and Treat     Referral Reason:   Specialty Services Required     Requested Specialty:   Physical Therapist     Number of Visits Requested:   1    EMG     Standing Status:   Future     Standing Expiration Date:   09/06/2022     Order Specific Question:   Which body part?     Answer:   Bilateral lower extremities         -PT for lumbar spondylosis  -Reviewed XR LS Spine above, all questions answered  -EMG B LE eval right leg pain  -Obtain Outside MRI LS Spine and other spine imaging as available  -Consideration for repeat MRI LS Spine may be given based upon date/quality of study  -Lidocaine 4% ointment topical BID prn #1 tube one refill start 09/06/2021   -No NSAID's recommended due to anticoagulation  with coronary artery disease and stents.  -Consideration for Gabapentin may be given. He is reluctant to take any pain medicines due to his heart disease.  -No opioids recommended at this time.  -He would like to see the results of the EMG prior to any interventions. If the EMG does not show a radicular process, he would like to try Lumbar MBB/RFA. He will follow up after EMG.  -He is currently not a surgical candidate per patient report. He will follow up with Dr Clydene PughBerkowitz if his symptoms worsen in case he develops surgical pathology      Controlled Substance Monitoring:    Acute and Chronic Pain Monitoring:   RX Monitoring 09/06/2021   Periodic Controlled Substance Monitoring Assessed functional status.          Discussed the risks, side effects, and symptoms that would warrant urgent or emergent physician evaluation of all medications prescribed today.     Discussed the risks of the above recommended procedures including but not limited to bleeding, infection, worsened pain, damage to surrounding structures, side effects, toxicity, allergic reactions to medications used, immune and stress-response dysfunction, fat necrosis, skin pigmentation changes, blood sugar elevation, headache, vision changes, need for surgery, as well as catastrophic injury such as vision loss, paralysis, stroke, spinal cord and/or plexus infarction or injury, intrathecal injection, spinal cord puncture, arachnoiditis, discitis, bowel or bladder incontinence, ventilator dependence, loss of use of the arms and/or legs, and death. Discussed off-label use of corticosteroids and how the Food and Drug Administration (FDA) has not approved corticosteroids for epidural use. Discussed the risks, benefits, alternative procedures, and alternatives to the procedure including no procedure at all. Discussed that we cannot undo any permanent neurologic damage or change the course of any underlying disease. The patient appears to be a good candidate for  the above recommended procedures, but no guarantees expressed or implied are given regarding the outcome of any procedure.  After thorough discussion, patient expressed understanding and willingness to proceed.    Provided education and counseling regarding the diagnosis, prognosis, and treatment options. All questions were answered. Encouraged him to follow-up with his primary care physician and/or specialists as required for his overall health and management of his comorbidities as well as any new positive symptoms mentioned in review of systems above. Care was provided within the definitions and limitations of our specialty practice. Encouraged lifestyle interventions including healthy habits, lifestyle changes, regular aerobic exercise and appropriate weight  maintenance as advised by their primary care physician or cardiovascular health provider. Discussed well care and disease prevention/maintenance.     All recommendations for therapy are provided to improve function with activities of daily living, decrease pain, and help develop an exercise program. All recommendations for medications are meant to help decrease pain, improve function with activities of daily living, maintain compliance with home exercise program, and improve quality of life.     Encouraged compliance with his home exercise program. Recommended compliance with physical therapy program as outlined above.     Discussed the elevated risks of excessive sedation while on pain medications. Advised him against driving or operating heavy machinery or performing any activities where he may harm himself or others while on pain medications. Particular caution was emphasized especially during dose adjustments and medication changes. Discussed the elevated risks of respiratory depression and death while on opioid medications, especially when combined with other sedative substances.     Discussed the risks of temporary disability, permanent disability,  morbidity, and mortality with poorly-managed or undiagnosed medical conditions and comorbidities. Emphasized the importance of timely medical evaluation and treatment as previously recommended by [place] or other medical professionals. Risks of not pursing these recommendations were emphasized. The patient was offered a treatment at our facility. The physician and patient have discussed in detail the risk of exposure to and/or potential harm posed by the COVID-19 virus with having office visits and procedures at this time versus the risk of delaying the visits and procedures. It is not possible to know either the risk of delaying the visits or procedure or chance of getting an infection with perfect accuracy, but a joint decision was made between the patient and the physician to proceed at this time with the scheduled visits and procedures.    Advised him that any lab testing, imaging, or other diagnostic test results are best discussed in person in the office so that we can provide a clear explanation of their significance and best treatment based upon these results. It is his responsibility to make and keep a follow up appointment to discuss these test results in person to discuss the significance of the findings and appropriate follow-up steps. He expressed complete understanding and agreement with the entire plan as outlined above. Portions of this note may have been typed, auto-populated, dictated or transcribed by voice recognition resulting in errors, omissions, or close substitutions which may be missed despite careful proofreading. Please contact the author for any questions or concerns.    Thank you Dr. Clydene Pugh for the opportunity to participate in this patient's care. If you have any questions or concerns, please do not hesitate to contact us.     Follow up:  Return in about 2 months (around 11/04/2021) for reassessment of pain and symptoms, EMG Internal, P.T. Internal Ref.    Grayland Jack, MD

## 2021-09-06 NOTE — Telephone Encounter (Signed)
Left detailed message for Office to have MRI Report faxed to our office Fax # [phone].

## 2021-09-25 IMAGING — CT CT BIOPSY
1 of 4 series · 12 of 32 positions shown, 18 images · non-contrast
Comparison: none

Addendum:
CLINICAL DATA: Right posterior pelvis and lower extremity pain.
Sacroiliac pain.

[Series 2: needle -guided injection · axial · 0.80mm/px · z∈[+986,+1076]mm · 12 of 55 slices shown, 18 images]
[im 5/55  soft-tissue]
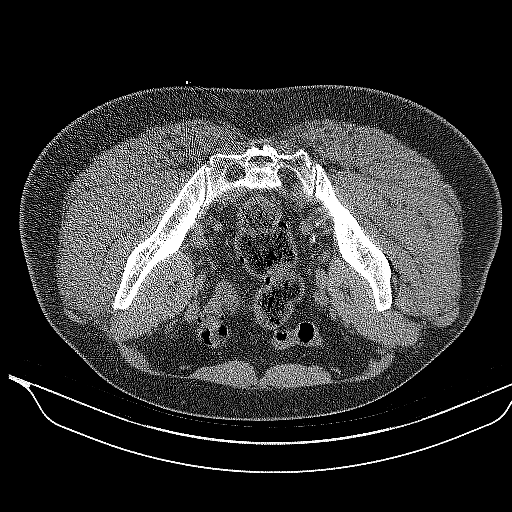
[im 5/55  bone]
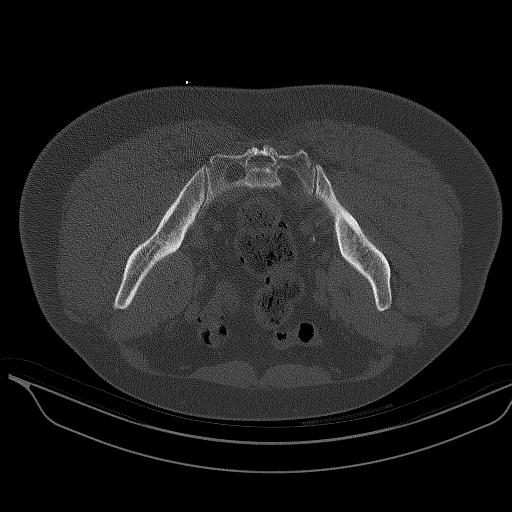
[im 9/55  soft-tissue]
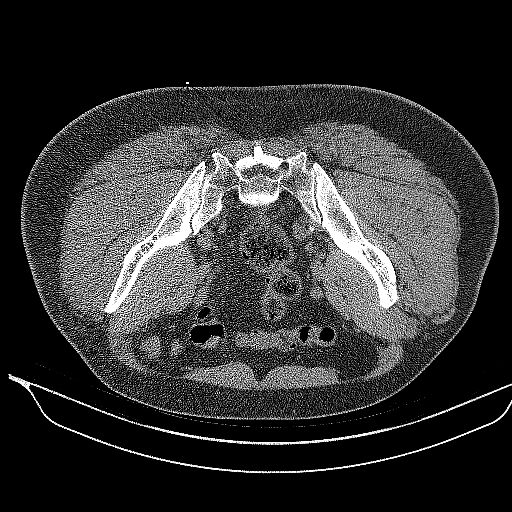
[im 13/55  soft-tissue]
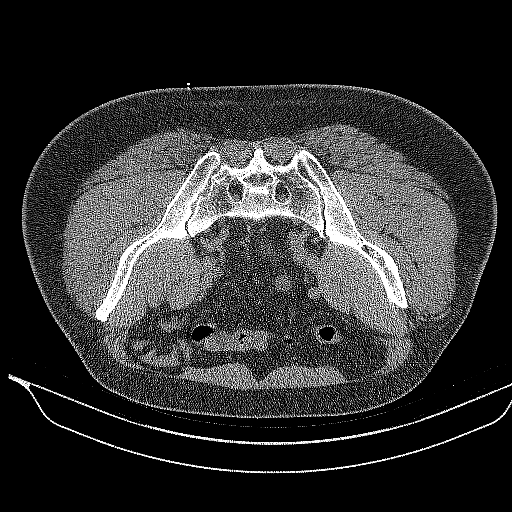
[im 17/55  soft-tissue]
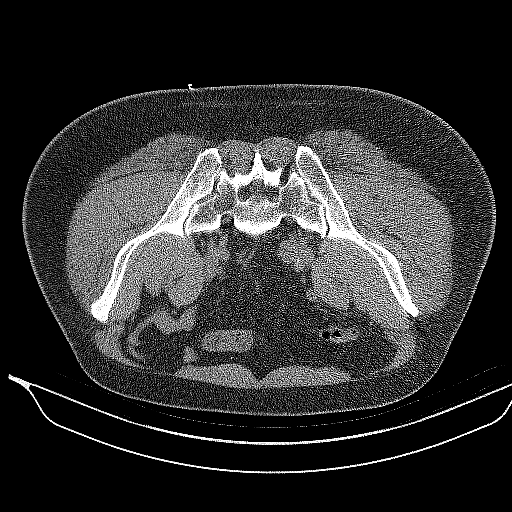
[im 21/55  soft-tissue]
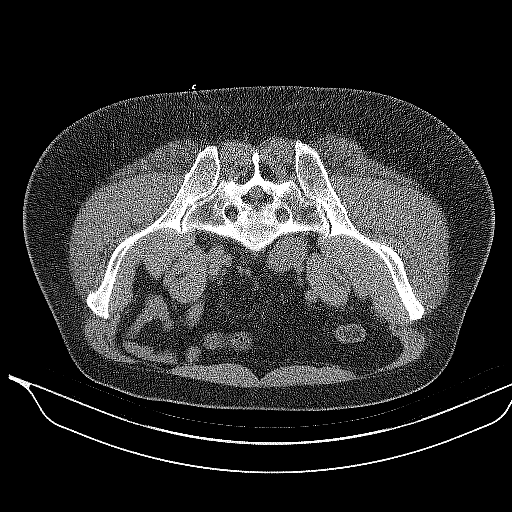
[im 25/55  soft-tissue]
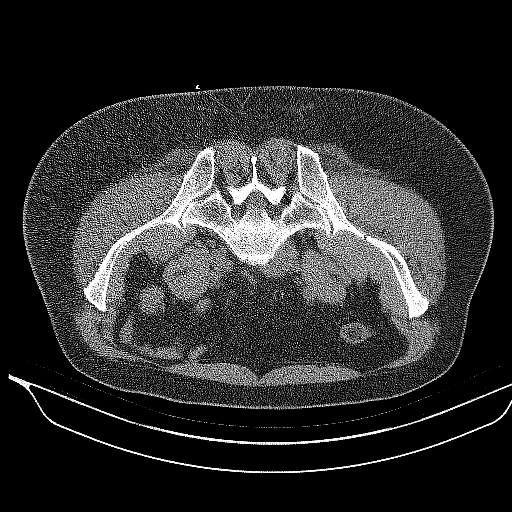
[im 30/55  soft-tissue]
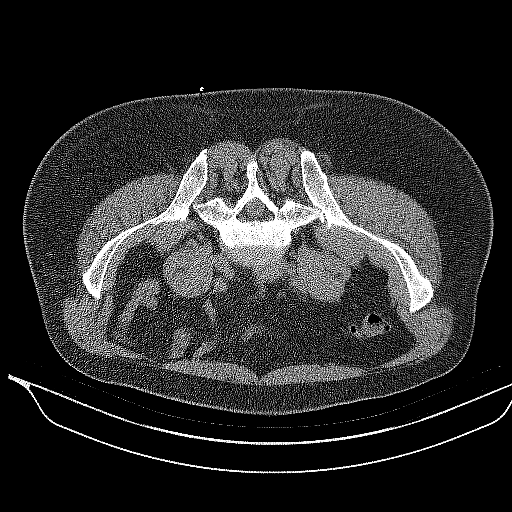
[im 34/55  soft-tissue]
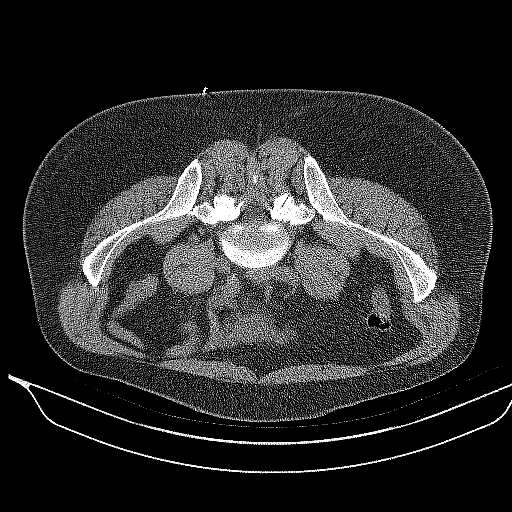
[im 38/55  soft-tissue]
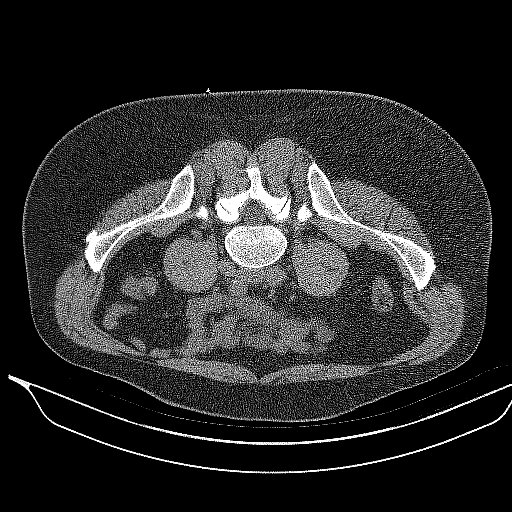
[im 38/55  lung]
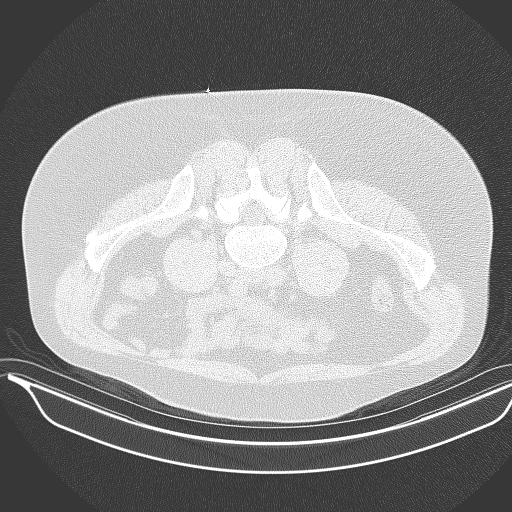
[im 38/55  bone]
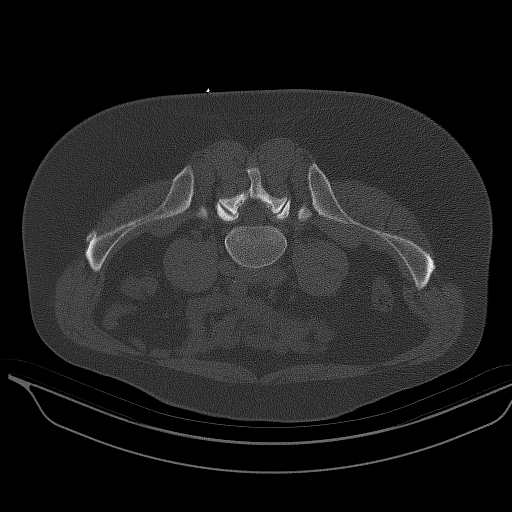
[im 42/55  soft-tissue]
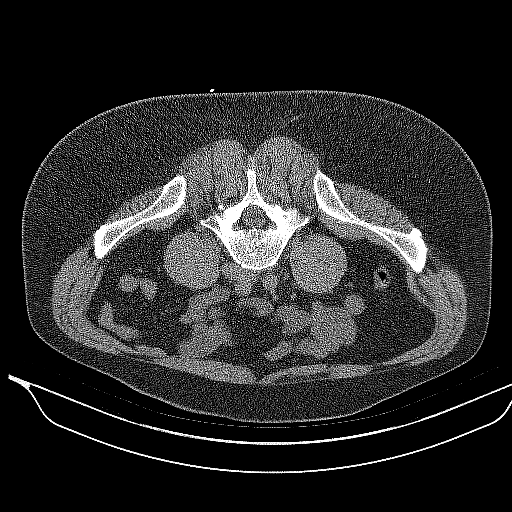
[im 42/55  lung]
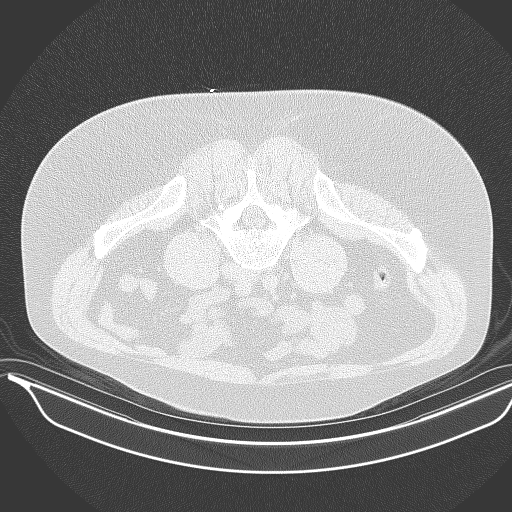
[im 46/55  soft-tissue]
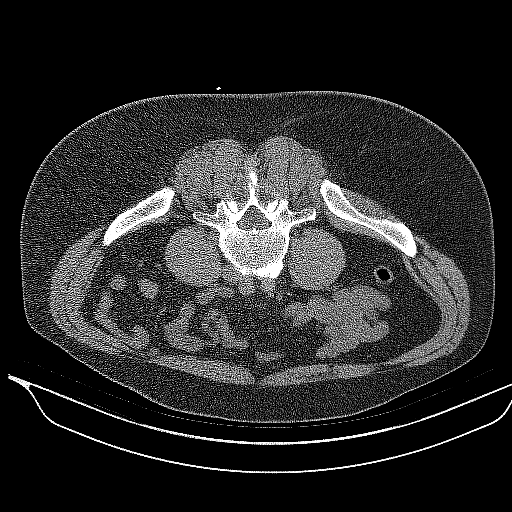
[im 46/55  lung]
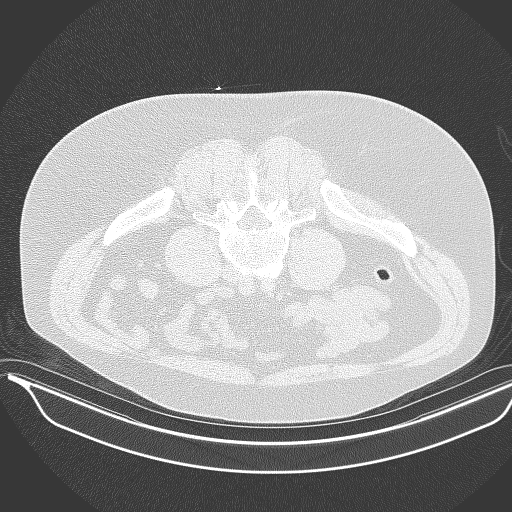
[im 50/55  soft-tissue]
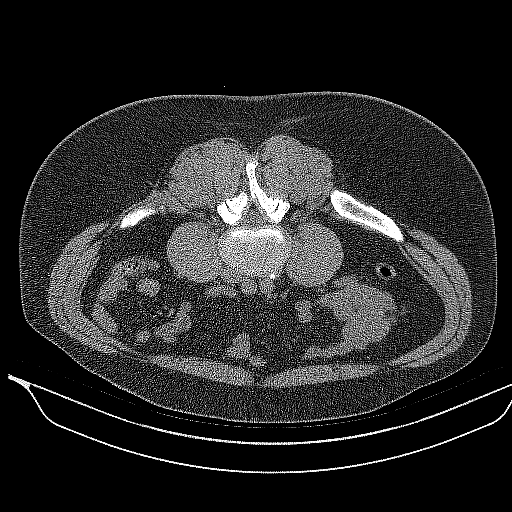
[im 50/55  lung]
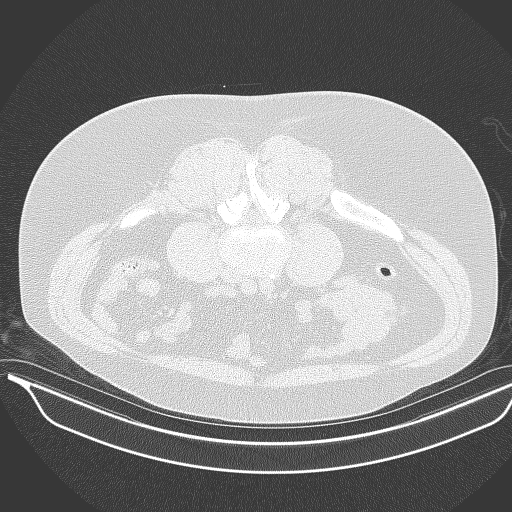

[12 of 32 positions shown; findings below may reference images not displayed]

EXAM:
Right CT GUIDED SI JOINT INJECTION



After local anesthesia with 1% lidocaine without epinephrine and
subsequent deep anesthesia, a 22 gauge spinal needle was advanced
into the right SI joint under intermittent CT guidance.

Once the needle was in satisfactory position, representative image
was captured with the needle demonstrated in the sacroiliac joint.
Subsequently, 1.5 mL 0.5% bupivacaine was injected into the right SI
joint. Needles removed and a sterile dressing applied.

No complications were observed.
IMPRESSION: Successful CT-guided anesthetic only right SI joint injection.

ADDENDUM:
Patient reported complete relief of his pain immediately after the
injection.

*** End of Addendum ***
EXAM:
Right CT GUIDED SI JOINT INJECTION



After local anesthesia with 1% lidocaine without epinephrine and
subsequent deep anesthesia, a 22 gauge spinal needle was advanced
into the right SI joint under intermittent CT guidance.

Once the needle was in satisfactory position, representative image
was captured with the needle demonstrated in the sacroiliac joint.
Subsequently, 1.5 mL 0.5% bupivacaine was injected into the right SI
joint. Needles removed and a sterile dressing applied.

No complications were observed.
IMPRESSION: Successful CT-guided anesthetic only right SI joint injection.

## 2021-10-02 ENCOUNTER — Encounter: Attending: Physical Medicine & Rehabilitation | Primary: Family Medicine

## 2021-10-23 ENCOUNTER — Ambulatory Visit
Admit: 2021-10-23 | Discharge: 2021-10-23 | Payer: PRIVATE HEALTH INSURANCE | Attending: Physical Medicine & Rehabilitation | Primary: Family Medicine

## 2021-10-23 ENCOUNTER — Encounter: Attending: Physical Medicine & Rehabilitation | Primary: Family Medicine

## 2021-10-23 DIAGNOSIS — M79604 Pain in right leg: Secondary | ICD-10-CM

## 2021-10-23 NOTE — Progress Notes (Signed)
Electromyography (EMG)/Nerve conduction studies (NCS) Report: Lower Extremity    Name: Melvin Sanders   Date of Birth: 1965-09-09  Date of Service: 10/23/2021   Provider: Grayland Jack, MD        INDICATIONS:  [name] is a 56 y.o. male who presents for electrodiagnostic evaluation for right leg pain. Both limbs are necessary to examine in order to evaluate for any evidence of systemic disease as well as establish normal baseline values from which to compare any abnormal unilateral findings. The study is explained and verbal consent to proceed is obtained.     NERVE CONDUCTION STUDIES:    Sensory nerve conduction studies: Bilateral sural and superficial peroneal sensory nerve conduction studies demonstrate normal peak latencies and amplitudes. Bilateral lower limb temperatures are normal.     Motor nerve conduction studies: Bilateral peroneal motor nerve conduction studies with pickup over the extensor digitorum brevis demonstrate normal distal latencies and amplitudes. Bilateral tibial motor nerve conduction studies with pickup over the abductor hallucis demonstrate normal distal latencies and amplitudes.    H reflex: Bilateral H reflexes are symmetric and not prolonged.    ELECTROMYOGRAPHY: A disposable monopolar needle is used to evaluate bilateral vastus medialis, tibialis anterior, extensor hallucis longus, peroneus longus, medial gastrocnemius, and lateral gastrocnemius. All of the muscles sampled are free of any increased insertional activity or any abnormal spontaneous activity. Motor unit recruitment is unremarkable. Bilateral low lumbar paraspinal muscle sampling is free of any increased insertional activity or any abnormal spontaneous activity.    The patient is on anticoagulation. The smallest gauge needle electrode is employed for the electromyography portion of the exam. Only superficial muscles are sampled, and all regions close to major vascular structures and nerves are  avoided. Pressure is maintained over the needle puncture sites when necessary. No abnormal hemostasis or bleeding is encountered during the exam. The patient is re-examined and confirmed to have no abnormal bleeding after completing the test.    SUMMARY:  This study is normal. There is no current electrodiagnostic evidence for an active bilateral lumbosacral motor radiculopathy or generalized large fiber sensorimotor peripheral polyneuropathy.     RECOMMENDATIONS: Today's study does not explain the patient's right leg pain. The patient should follow up as previously instructed. If his symptoms persist or worsen, further electrodiagnostic evaluation may be considered if the patient is agreeable. Clinical correlation is recommended.

## 2021-11-06 ENCOUNTER — Encounter: Attending: Adult Health | Primary: Family Medicine

## 2021-11-20 ENCOUNTER — Ambulatory Visit
Admit: 2021-11-20 | Discharge: 2021-11-20 | Payer: PRIVATE HEALTH INSURANCE | Attending: Adult Health | Primary: Family Medicine

## 2021-11-20 DIAGNOSIS — G588 Other specified mononeuropathies: Secondary | ICD-10-CM

## 2021-11-20 NOTE — Progress Notes (Signed)
Rolan Wrightsman Moren Sanders  ([id number])    11/20/2021    Subjective:     Melvin Sanders is 56 y.o. male who complains today of:    Chief Complaint   Patient presents with    Follow-up         Allergies:  Statins, Aleve [naproxen], and Iodine    Past Medical History:   Diagnosis Date    Cancer (HCC)     Skin    CHF (congestive heart failure) (HCC)     Chronic back pain     Coronary artery disease     Depression     Hypertension     Sleep apnea      Past Surgical History:   Procedure Laterality Date    KNEE SURGERY Right      Family History   Problem Relation Age of Onset    Diabetes Mother     Heart Disease Father     High Blood Pressure Father      Social History     Socioeconomic History    Marital status: Unknown     Spouse name: Not on file    Number of children: Not on file    Years of education: Not on file    Highest education level: Not on file   Occupational History    Not on file   Tobacco Use    Smoking status: Former     Types: Cigarettes    Smokeless tobacco: Never   Substance and Sexual Activity    Alcohol use: Not on file    Drug use: Not on file    Sexual activity: Not on file   Other Topics Concern    Not on file   Social History Narrative    Not on file     Social Determinants of Health     Financial Resource Strain: Not on file   Food Insecurity: Not on file   Transportation Needs: Not on file   Physical Activity: Not on file   Stress: Not on file   Social Connections: Not on file   Intimate Partner Violence: Not on file   Housing Stability: Not on file       Current Outpatient Medications on File Prior to Visit   Medication Sig Dispense Refill    Aspirin (VAZALORE) 81 MG CAPS Take by mouth      pravastatin (PRAVACHOL) 40 MG tablet Take 1 tablet by mouth daily      DULoxetine (CYMBALTA) 60 MG extended release capsule Take 1 capsule by mouth daily      pantoprazole (PROTONIX) 40 MG tablet Take 1 tablet by mouth daily      losartan (COZAAR) 25 MG tablet Take 1 tablet by mouth daily       ezetimibe (ZETIA) 10 MG tablet Take 1 tablet by mouth daily      ISOSORBIDE DINITRATE PO Take 60 mg by mouth      clopidogrel (PLAVIX) 75 MG tablet Take 1 tablet by mouth daily      nitroGLYCERIN (NITROSTAT) 0.4 MG SL tablet Place 1 tablet under the tongue every 5 minutes as needed for Chest pain up to max of 3 total doses. If no relief after 1 dose, call 911.      metoprolol succinate (TOPROL XL) 25 MG extended release tablet Take 1 tablet by mouth daily (Patient not taking: Reported on 11/20/2021)      aspirin 81 MG EC tablet Take 81 mg by mouth  daily (Patient not taking: Reported on 11/20/2021)      lidocaine (LMX) 4 % cream Apply a half dollar sized amount to intact skin topically up to twice daily as needed for pain (Patient not taking: Reported on 11/20/2021) 45 g 1     No current facility-administered medications on file prior to visit.           Pt presents today for a f/u of his pain.  PCP is Janice CoffinKenneth Kall, DO.  Patient saw Dr. Christie BeckersMathur on 10/23/2021 for lower extremity EMG.  Per report study was normal.  Did not explain right lower extremity pain.  He had his initial consult on 09/06/2021 with Dr. Christie BeckersMathur.  He has seen Dr. Clydene PughBerkowitz in the past.  He describes pain in the right side of low back that goes into the right leg.  Denies numbness and tingling.  He has done PT.  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  Denies seizures.  He works in a [employer] now. 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  He says he is legally blind. He says his pain started years ago from work injury.    He says most of his pain is on the Rt side. He says "I can put my finger on it".  He says pushing on that area can send pain into his Rt thigh. He says if he pushes on the side of his Rt thigh he can feel it in his low back, too.    Referral to physical therapy given. He didn't start this again, stating last time he did this it aggravated  his pain. They tried doing traction. Says done in November and December of 2022 he believes in [place]. He continues to do his HEP he learned as well. Ordered lidocaine 4% cream to apply twice daily.  No NSAIDs recommended.  Consideration of gabapentin.  No opioids recommended.  Consideration of lumbar MBB.  He did have MRI in 02/27/2021 at [facility] in [place] report shows multilevel bulging disks and facet hypertrophy.    Pt feels pain level 4/10.  Pt feels that standing straight up, brushing teeth, prolonged standing in one position, car ride makes the pain worse, and not sure what makes the pain better.   Pt denies radiating numbness and tingling.  Denies recent falls, injuries or trauma.  Pt denies new weakness. Pt reports PT has been done in the past.         Review of Systems   Constitutional:  Negative for fever.   HENT: Negative.     Eyes: Negative.    Respiratory:  Negative for cough and shortness of breath.    Cardiovascular:  Negative for chest pain.   Gastrointestinal: Negative.  Negative for constipation, diarrhea and nausea.   Endocrine: Negative.    Genitourinary: Negative.    Musculoskeletal:  Positive for back pain.   Skin: Negative.    Neurological:  Negative for dizziness and weakness.   Psychiatric/Behavioral: Negative.       Reviewed Dr. Senaida LangeMathur's notes and reports:  XR LS Spine 01/10/21: mild degenerative changes, scoliosis, no fractures, flexion extension preserved, mild bilateral hip arthritis.   ORT Score = 1, low risk    Objective:     Vitals:  BP 128/76   Temp 98 F (36.7 C)   Wt 180 lb (81.6 kg)   BMI 25.83 kg/m  Physical Exam  Vitals and nursing note reviewed.     This is a pleasant male who answers questions appropriately and follows commands.  Pt is alert and oriented x 3.  Recent and remote memory is intact.  Mood and affect, judgement and insight are normal.  No signs of distress, no dyspnea or SOB noted. HEENT: PERRL.  Neck is supple, trachea midline.  No  lymphadenopathy noted.   Decreased ROM with flexion and extension of low back.  He has point tenderness over Rt iliac crest with palpation.  Non-tender with palpation to lumbar spine today.  Negative SLR but increases Rt sided low back pain and iliac crest pain.  Tightness in both hamstrings noted.  Pt is able to briefly heel walk and toe walk. Balance and coordination normal.  Strength is functional for ambulation. Cranial nerves II-XII are intact.         Assessment:      Diagnosis Orders   1. Cluneal neuropathy  PR INJECTION AA&/STRD OTHER PERIPHERAL NERVE/BRANCH      2. Spondylosis of lumbar spine            Plan:          No orders of the defined types were placed in this encounter.      Orders Placed This Encounter   Procedures    PR INJECTION AA&/STRD OTHER PERIPHERAL NERVE/BRANCH     Rt cluneal nerve block with SM     Standing Status:   Future     Standing Expiration Date:   02/18/2022     Discussed options with the patient today. Anatomic model pathology was shown and reviewed with pt. Discussed care with Dr. Christie Beckers.  We will order right cluneal nerve block to see if candidate for RF ablation.  May consider lumbar MBB on the right in the future. All questions were answered. Discussed home exercise program.  Relevant imaging and pain generators reviewed. Pt verbalized understanding and agrees with above plan. Pt has chronic pain.      OARRS was reviewed.      Follow up:  Return for for procedure with Dr. Christie Beckers.    Lauralyn Primes Onnie Hatchel, APRN - CNP
